# Patient Record
Sex: Female | Born: 1985
Health system: Southern US, Community
[De-identification: ages and names within clinical notes are randomized; demographics above are authoritative.]

## PROBLEM LIST (undated history)

## (undated) ENCOUNTER — Inpatient Hospital Stay (HOSPITAL_COMMUNITY): Payer: Self-pay

## (undated) DIAGNOSIS — D649 Anemia, unspecified: Secondary | ICD-10-CM

## (undated) DIAGNOSIS — G43909 Migraine, unspecified, not intractable, without status migrainosus: Secondary | ICD-10-CM

## (undated) HISTORY — PX: WISDOM TOOTH EXTRACTION: SHX21

## (undated) HISTORY — DX: Migraine, unspecified, not intractable, without status migrainosus: G43.909

---
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
## 2013-11-20 ENCOUNTER — Encounter: Payer: Self-pay | Admitting: Neurology

## 2013-11-23 ENCOUNTER — Encounter: Payer: Self-pay | Admitting: Neurology

## 2013-11-23 ENCOUNTER — Ambulatory Visit (INDEPENDENT_AMBULATORY_CARE_PROVIDER_SITE_OTHER): Payer: BC Managed Care – PPO | Admitting: Neurology

## 2013-11-23 ENCOUNTER — Encounter (INDEPENDENT_AMBULATORY_CARE_PROVIDER_SITE_OTHER): Payer: Self-pay

## 2013-11-23 VITALS — BP 100/70 | HR 93 | Ht 64.0 in | Wt 155.0 lb

## 2013-11-23 DIAGNOSIS — G43009 Migraine without aura, not intractable, without status migrainosus: Secondary | ICD-10-CM | POA: Insufficient documentation

## 2013-11-23 NOTE — Progress Notes (Signed)
[facility]    Provider:  Dr Hosie PoissonSumner Referring Provider: Farris HasMorrow, Aaron, MD Primary Care Physician:  Farris HasMORROW, AARON, MD  CC:  Headache   HPI:  Autumn Evans is a 28 y.o. female here as a referral from Dr. Kateri PlummerMorrow for headache evaluation  6 year history of migraine headaches, has been seen by neurologist in the past and tried on Topamax and Maxalt, no benefit or had concern with side effects. Typically occuring 2x a week, can last a few days. Comes on slowly, progresses to a severe headache. No aura. Typically right temporal, pounding sensation. Notes some nausea and emesis. + Phono and photophobia. Feels better in a dark quiet room. Most recently developed some blurry vision and dizzy sensation. No focal weakness or sensory changes. Headaches can get up to 10/10 but typically 8/10 at its worst. Notes they can be triggered by stress or skipping a caffeine drink (drinks 2 cans a day).  Has tried Excedrin migraine in the past. Recently started Imitrex and Elavil 10mg , has not noted much change but just started. In the past tried Topamax but did not like side effects and did not notice any benefit. Currently taking Ortho-cyclen, has been on this for a few months.   No family history of migraines. Reports having had a CT of her head around 6-7 years ago and she report it was normal.   Review of Systems: Out of a complete 14 system review, the patient complains of only the following symptoms, and all other reviewed systems are negative. + headache, dizziness  History   Social History  . Marital Status: Unknown    Spouse Name: N/A    Number of Children: 1  . Years of Education: masters   Occupational History  . [employer]    Social History Main Topics  . Smoking status: Never Smoker   . Smokeless tobacco: Never Used  . Alcohol Use: Yes     Comment: OCC.  . Drug Use: No  . Sexual Activity: Yes   Other Topics Concern  . Not on file   Social  History Narrative   Patient lives at with her son and boyfriends, she drinks 4 soda's  A day.    Family History  Problem Relation Age of Onset  . Cancer Maternal Grandmother     Past Medical History  Diagnosis Date  . Migraines     No past surgical history on file.  Current Outpatient Prescriptions  Medication Sig Dispense Refill  . amitriptyline (ELAVIL) 10 MG tablet Take 10 mg by mouth at bedtime as needed for sleep.      . norgestimate-ethinyl estradiol (ORTHO-CYCLEN, 28,) 0.25-35 MG-MCG tablet Take 1 tablet by mouth daily.      . SUMAtriptan (IMITREX) 50 MG tablet Take 50 mg by mouth every 2 (two) hours as needed for migraine or headache. May repeat in 2 hours if headache persists or recurs.      . SUMAtriptan Succinate (SUMAVEL DOSEPRO) 6 MG/0.5ML SOTJ Inject 1 Device into the skin as needed.       No current facility-administered medications for this visit.    Allergies as of 11/23/2013  . (No Known Allergies)    Vitals: BP 100/70  Pulse 93  Ht 5\' 4"  (1.626 m)  Wt 155 lb (70.308 kg)  BMI 26.59 kg/m2 Last Weight:  Wt Readings from Last 1 Encounters:  11/23/13 155 lb (70.308 kg)   Last Height:   Ht Readings from Last 1 Encounters:  11/23/13 5\' 4"  (1.626 m)     Physical exam: Exam: Gen: NAD, conversant Eyes: anicteric sclerae, moist conjunctivae HENT: Atraumatic, oropharynx clear Neck: Trachea midline; supple,  Lungs: CTA, no wheezing, rales, rhonic                          CV: RRR, no MRG Abdomen: Soft, non-tender;  Extremities: No peripheral edema  Skin: Normal temperature, no rash,  Psych: Appropriate affect, pleasant  Neuro: MS: AA&Ox3, appropriately interactive, normal affect   Speech: fluent w/o paraphasic error  Memory: good recent and remote recall  CN: PERRL, EOMI no nystagmus, no ptosis, sensation intact to LT V1-V3 bilat, face symmetric, no weakness, hearing grossly intact, palate elevates symmetrically, shoulder shrug 5/5 bilat,    tongue protrudes midline, no fasiculations noted.  Motor: normal bulk and tone Strength: 5/5  In all extremities  Coord: rapid alternating and point-to-point (FNF, HTS) movements intact.  Reflexes: symmetrical, bilat downgoing toes  Sens: LT intact in all extremities  Gait: posture, stance, stride and arm-swing normal. Tandem gait intact. Able to walk on heels and toes. Romberg absent.   Assessment:  After physical and neurologic examination, review of laboratory studies, imaging, neurophysiology testing and pre-existing records, assessment will be reviewed on the problem list.  Plan:  Treatment plan and additional workup will be reviewed under Problem List.  1)Migraine without aura  27y/o woman presenting for initial evaluation of chronic headaches which are consistent with a diagnosis of migraine without aura. Physical exam is overall unremarkable. Patient was recently started on Elavil and Imitrex by her PCP but it appears that she was not taking the Elavil on a nightly basis as instructed. Counseled patient to take the Elavil 10mg  nightly, even if she is not having headache symptoms. Re-instructed her on proper use of the Imitrex. She expressed understanding. Counseled her on potential side effects. Patient will call office in 4 weeks to update me on her headaches. If no improvement would consider MRI/V of the brain, increasing the Elavil or switching prophylactic agents. Follow up in 3 months.   Elspeth Cho, DO  [facility] [address] [place], [place] [address]  Phone [phone] Fax [phone]

## 2013-11-23 NOTE — Patient Instructions (Addendum)
Overall you are doing fairly well but I do want to suggest a few things today:   Remember to drink plenty of fluid, eat healthy meals and do not skip any meals. Try to eat protein with a every meal and eat a healthy snack such as fruit or nuts in between meals. Try to keep a regular sleep-wake schedule and try to exercise daily, particularly in the form of walking, 20-30 minutes a day, if you can.   As far as your medications are concerned, I would like to suggest the following: 1)Please take amitriptyline 10mg  nightly. Please taken this on a nightly basis. 2)Please take the sumitriptan as needed. Please take one tablet immediately at onset of severe headache, if no improvement in 2 hours then take the 2nd tablet. No more than 2 tablets in 24hours.   Please call our office in 4 weeks and let me know how you are doing.   I would like to see you back in 3 months.  Please call us with any interim questions, concerns, problems, updates or refill requests.   My clinical assistant and will answer any of your questions and relay your messages to me and also relay most of my messages to you.   Our phone number is [phone]. We also have an after hours call service for urgent matters and there is a physician on-call for urgent questions. For any emergencies you know to call 911 or go to the nearest emergency room

## 2014-01-29 ENCOUNTER — Encounter (HOSPITAL_COMMUNITY): Payer: Self-pay | Admitting: Emergency Medicine

## 2014-01-29 ENCOUNTER — Emergency Department (HOSPITAL_COMMUNITY)
Admission: EM | Admit: 2014-01-29 | Discharge: 2014-01-29 | Disposition: A | Discharge status: Home / Self Care | Payer: No Typology Code available for payment source | Attending: Emergency Medicine | Admitting: Emergency Medicine

## 2014-01-29 DIAGNOSIS — Y92410 Unspecified street and highway as the place of occurrence of the external cause: Secondary | ICD-10-CM | POA: Insufficient documentation

## 2014-01-29 DIAGNOSIS — Z79899 Other long term (current) drug therapy: Secondary | ICD-10-CM | POA: Insufficient documentation

## 2014-01-29 DIAGNOSIS — S46909A Unspecified injury of unspecified muscle, fascia and tendon at shoulder and upper arm level, unspecified arm, initial encounter: Secondary | ICD-10-CM | POA: Insufficient documentation

## 2014-01-29 DIAGNOSIS — S4980XA Other specified injuries of shoulder and upper arm, unspecified arm, initial encounter: Secondary | ICD-10-CM | POA: Insufficient documentation

## 2014-01-29 DIAGNOSIS — IMO0002 Reserved for concepts with insufficient information to code with codable children: Secondary | ICD-10-CM | POA: Insufficient documentation

## 2014-01-29 DIAGNOSIS — Y9389 Activity, other specified: Secondary | ICD-10-CM | POA: Insufficient documentation

## 2014-01-29 DIAGNOSIS — G43909 Migraine, unspecified, not intractable, without status migrainosus: Secondary | ICD-10-CM | POA: Insufficient documentation

## 2014-01-29 DIAGNOSIS — M549 Dorsalgia, unspecified: Secondary | ICD-10-CM

## 2014-01-29 MED ORDER — METHOCARBAMOL 500 MG PO TABS
500.0000 mg | ORAL_TABLET | Freq: Once | ORAL | Status: DC
  Filled 2014-01-29: qty 1

## 2014-01-29 MED ORDER — METHOCARBAMOL 500 MG PO TABS: 500.0000 mg | ORAL_TABLET | Freq: Two times a day (BID) | ORAL | Status: DC

## 2014-01-29 MED ORDER — IBUPROFEN 600 MG PO TABS: 600.0000 mg | ORAL_TABLET | Freq: Four times a day (QID) | ORAL | Status: DC | PRN

## 2014-01-29 MED ORDER — HYDROCODONE-ACETAMINOPHEN 5-325 MG PO TABS: 1.0000 | ORAL_TABLET | Freq: Four times a day (QID) | ORAL | Status: DC | PRN

## 2014-01-29 MED ORDER — HYDROCODONE-ACETAMINOPHEN 5-325 MG PO TABS
2.0000 | ORAL_TABLET | Freq: Once | ORAL | Status: DC
  Filled 2014-01-29: qty 2

## 2014-01-29 MED ORDER — IBUPROFEN 400 MG PO TABS
800.0000 mg | ORAL_TABLET | Freq: Once | ORAL | Status: DC
  Filled 2014-01-29: qty 2
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
## 2014-02-01 NOTE — ED Provider Notes (Signed)
History/physical exam/procedure(s) were performed by non-physician practitioner and as supervising physician I was immediately available for consultation/collaboration. I have reviewed all notes and am in agreement with care and plan.  Hilario Quarry, MD 02/01/14 [phone]

## 2014-02-02 ENCOUNTER — Emergency Department (HOSPITAL_COMMUNITY): Payer: No Typology Code available for payment source

## 2014-02-02 ENCOUNTER — Emergency Department (HOSPITAL_COMMUNITY)
Admission: EM | Admit: 2014-02-02 | Discharge: 2014-02-02 | Disposition: A | Discharge status: Home / Self Care | Payer: No Typology Code available for payment source | Attending: Emergency Medicine | Admitting: Emergency Medicine

## 2014-02-02 ENCOUNTER — Encounter (HOSPITAL_COMMUNITY): Payer: Self-pay | Admitting: Emergency Medicine

## 2014-02-02 DIAGNOSIS — S93409A Sprain of unspecified ligament of unspecified ankle, initial encounter: Secondary | ICD-10-CM | POA: Insufficient documentation

## 2014-02-02 DIAGNOSIS — Z79899 Other long term (current) drug therapy: Secondary | ICD-10-CM | POA: Insufficient documentation

## 2014-02-02 DIAGNOSIS — Y9389 Activity, other specified: Secondary | ICD-10-CM | POA: Insufficient documentation

## 2014-02-02 DIAGNOSIS — S93401A Sprain of unspecified ligament of right ankle, initial encounter: Secondary | ICD-10-CM

## 2014-02-02 DIAGNOSIS — Z3202 Encounter for pregnancy test, result negative: Secondary | ICD-10-CM | POA: Insufficient documentation

## 2014-02-02 DIAGNOSIS — Y9289 Other specified places as the place of occurrence of the external cause: Secondary | ICD-10-CM | POA: Insufficient documentation

## 2014-02-02 DIAGNOSIS — G43909 Migraine, unspecified, not intractable, without status migrainosus: Secondary | ICD-10-CM | POA: Insufficient documentation

## 2014-02-02 DIAGNOSIS — X500XXA Overexertion from strenuous movement or load, initial encounter: Secondary | ICD-10-CM | POA: Insufficient documentation

## 2014-02-02 LAB — POC URINE PREG, ED: Preg Test, Ur: NEGATIVE

## 2014-02-02 MED ORDER — HYDROCODONE-ACETAMINOPHEN 5-325 MG PO TABS
1.0000 | ORAL_TABLET | Freq: Once | ORAL | Status: AC
  Administered 2014-02-02: 1 via ORAL
  Filled 2014-02-02: qty 1

## 2014-02-02 MED ORDER — IBUPROFEN 600 MG PO TABS: 600.0000 mg | ORAL_TABLET | Freq: Four times a day (QID) | ORAL | Status: DC | PRN

## 2014-02-02 MED ORDER — HYDROCODONE-ACETAMINOPHEN 5-325 MG PO TABS: 1.0000 | ORAL_TABLET | Freq: Four times a day (QID) | ORAL | Status: DC | PRN

## 2014-02-02 NOTE — ED Provider Notes (Addendum)
Medical screening examination/treatment/procedure(s) were performed by non-physician practitioner and as supervising physician I was immediately available for consultation/collaboration.   EKG Interpretation None        Olivia Mackie, MD 02/02/14 2751  Olivia Mackie, MD 02/16/14 [phone]

## 2014-02-02 NOTE — Discharge Instructions (Signed)
Please call your doctor for a followup appointment within 24-48 hours. When you talk to your doctor please let them know that you were seen in the emergency department and have them acquire all of your records so that they can discuss the findings with you and formulate a treatment plan to fully care for your new and ongoing problems. Please call and set up an appointment with your primary care provider to be seen and reassessed this week Please call and set up an appointment with orthopedics to be reassessed Please rest, ice, elevate-toes above his nose Please apply ice at least 5-6 times per day with 20 minute intervals Please take medications as prescribed-while on pain medications there is to be no drinking alcohol, driving, operating any heavy machinery if there is extra please dispose in a proper manner. Please do not take any extra Tylenol with this medication for this can be to Tylenol overdose and liver failure. Please avoid any physical or shortness activity Please use crutches to prevent any weightbearing exercises Please continue to monitor symptoms closely and if symptoms are to worsen or change (fever greater than 101, chills, chest pain, shortness of breath, difficulty breathing, numbness, tingling, fall, injury, loss of sensation, swelling, inability to wiggle toes, redness, warmth to touch, red streaks running up the leg) please report back to the ED immediately    Ankle Sprain An ankle sprain is an injury to the strong, fibrous tissues (ligaments) that hold your ankle bones together.  HOME CARE   Put ice on your ankle for 1 2 days or as told by your doctor.  Put ice in a plastic bag.  Place a towel between your skin and the bag.  Leave the ice on for 15-20 minutes at a time, every 2 hours while you are awake.  Only take medicine as told by your doctor.  Raise (elevate) your injured ankle above the level of your heart as much as possible for 2 3 days.  Use crutches if your  doctor tells you to. Slowly put your own weight on the affected ankle. Use the crutches until you can walk without pain.  If you have a plaster splint:  Do not rest it on anything harder than a pillow for 24 hours.  Do not put weight on it.  Do not get it wet.  Take it off to shower or bathe.  If given, use an elastic wrap or support stocking for support. Take the wrap off if your toes lose feeling (numb), tingle, or turn cold or blue.  If you have an air splint:  Add or let out air to make it comfortable.  Take it off at night and to shower and bathe.  Wiggle your toes and move your ankle up and down often while you are wearing it. GET HELP RIGHT AWAY IF:   Your toes lose feeling (numb) or turn blue.  You have severe pain that is increasing.  You have rapidly increasing bruising or puffiness (swelling).  Your toes feel very cold.  You lose feeling in your foot.  Your medicine does not help your pain. MAKE SURE YOU:   Understand these instructions.  Will watch your condition.  Will get help right away if you are not doing well or get worse. Document Released: 02/06/2008 Document Revised: 05/14/2012 Document Reviewed: 03/03/2012 [facility] Patient Information 2014 [place], [place].

## 2014-02-02 NOTE — ED Provider Notes (Signed)
CSN: [id number]     Arrival date & time 02/02/14  0155 History   First MD Initiated Contact with Patient 02/02/14 0228     Chief Complaint  Patient presents with  . Ankle Pain     (Consider location/radiation/quality/duration/timing/severity/associated sxs/prior Treatment) The history is provided by the patient. No language interpreter was used.  Kharizma Lesnick is a 28 year old female with past medical history migraines presenting to the ED with right ankle pain that started prior to arrival to the ED. As per patient, reported that when she got out of bed to head towards the kitchen she missed the first step and landed on her right ankle in an awkward manner. Reported that at this incident should instantaneous pain described as a constant throbbing sensation localized to lateral aspect of right ankle without radiation. Stated that she did not elevate, ice, take any medications-reported that she came straight to the emergency department. Denied numbness, tingling, loss of sensation, previous injury. PCP Dr. Kateri Plummer  Past Medical History  Diagnosis Date  . Migraines    History reviewed. No pertinent past surgical history. Family History  Problem Relation Age of Onset  . Cancer Maternal Grandmother    History  Substance Use Topics  . Smoking status: Never Smoker   . Smokeless tobacco: Never Used  . Alcohol Use: Yes     Comment: OCC.   OB History   Grav Para Term Preterm Abortions TAB SAB Ect Mult Living                 Review of Systems  Musculoskeletal: Positive for arthralgias (right ankle pain ).  Neurological: Negative for weakness and numbness.      Allergies  Review of patient's allergies indicates no known allergies.  Home Medications   Prior to Admission medications   Medication Sig Start Date End Date Taking? Authorizing Provider  amitriptyline (ELAVIL) 10 MG tablet Take 10 mg by mouth at bedtime as needed for sleep.   Yes Historical Provider, MD  methocarbamol  (ROBAXIN) 500 MG tablet Take 1 tablet (500 mg total) by mouth 2 (two) times daily. 01/29/14  Yes Junius Finner, PA-C  norgestimate-ethinyl estradiol (ORTHO-CYCLEN, 28,) 0.25-35 MG-MCG tablet Take 1 tablet by mouth daily.   Yes Historical Provider, MD  SUMAtriptan (IMITREX) 50 MG tablet Take 50 mg by mouth every 2 (two) hours as needed for migraine or headache. May repeat in 2 hours if headache persists or recurs.   Yes Historical Provider, MD  HYDROcodone-acetaminophen (NORCO/VICODIN) 5-325 MG per tablet Take 1-2 tablets by mouth every 6 (six) hours as needed for moderate pain or severe pain. 01/29/14   Junius Finner, PA-C  ibuprofen (ADVIL,MOTRIN) 600 MG tablet Take 1 tablet (600 mg total) by mouth every 6 (six) hours as needed. 01/29/14   Junius Finner, PA-C   BP 118/74  Pulse 120  Temp(Src) 98.6 F (37 C) (Oral)  Resp 18  SpO2 97%  LMP 01/16/2014 Physical Exam  Nursing note and vitals reviewed. Constitutional: She is oriented to person, place, and time. She appears well-developed and well-nourished. No distress.  HENT:  Head: Normocephalic and atraumatic.  Mouth/Throat: Oropharynx is clear and moist. No oropharyngeal exudate.  Eyes: Conjunctivae and EOM are normal. Right eye exhibits no discharge. Left eye exhibits no discharge.  Neck: Normal range of motion. Neck supple. No tracheal deviation present.  Cardiovascular: Normal rate, regular rhythm and normal heart sounds.  Exam reveals no friction rub.   No murmur heard. Pulses:  Radial pulses are 2+ on the right side, and 2+ on the left side.       Dorsalis pedis pulses are 2+ on the right side, and 2+ on the left side.       Posterior tibial pulses are 2+ on the right side, and 2+ on the left side.  Pulmonary/Chest: Effort normal and breath sounds normal. No respiratory distress. She has no wheezes. She has no rales.  Musculoskeletal: She exhibits edema and tenderness.       Right ankle: She exhibits decreased range of motion,  swelling and ecchymosis. She exhibits no deformity, no laceration and normal pulse. Tenderness. Lateral malleolus and AITFL tenderness found. No medial malleolus, no CF ligament, no posterior TFL, no head of 5th metatarsal and no proximal fibula tenderness found.       Feet:  Mild swelling identified to the lateral aspect of the right ankle - mild beginnings of ecchymosis noted. Negative erythema, inflammation, lesions, sores noted. Discomfort upon palpation to the region. Negative crepitus. Pain with inversion, eversion noted. Patient able to dorsiflex and plantar flex. Patient able to wiggle toes without difficulty.   Lymphadenopathy:    She has no cervical adenopathy.  Neurological: She is alert and oriented to person, place, and time. No cranial nerve deficit. She exhibits normal muscle tone. Coordination normal.  Cranial nerves III-XII grossly intact Strength 5+/5+ to lower extremities bilaterally with resistance applied, equal distribution noted Strength intact to digits of the feet bilaterally  Sensation intact with differentiation to sharp and dull touch   Skin: Skin is warm and dry. No rash noted. She is not diaphoretic. No erythema.  Psychiatric: She has a normal mood and affect. Her behavior is normal. Thought content normal.    ED Course  Procedures (including critical care time)  Results for orders placed during the hospital encounter of 02/02/14  POC URINE PREG, ED      Result Value Ref Range   Preg Test, Ur NEGATIVE  NEGATIVE    Labs Review Labs Reviewed  POC URINE PREG, ED    Imaging Review Dg Ankle Complete Right  02/02/2014   CLINICAL DATA:  Ankle twisting injury.  Pain.  EXAM: RIGHT ANKLE - COMPLETE 3+ VIEW  COMPARISON:  None.  FINDINGS: No fracture. Ankle mortise is normally spaced and aligned. Soft tissues are unremarkable.  IMPRESSION: No fracture or joint abnormality.   Electronically Signed   By: Amie Portland M.D.   On: 02/02/2014 02:40     EKG  Interpretation None      MDM   Final diagnoses:  Right ankle sprain    Medications  HYDROcodone-acetaminophen (NORCO/VICODIN) 5-325 MG per tablet 1 tablet (1 tablet Oral Given 02/02/14 0312)   Filed Vitals:   02/02/14 0200  BP: 118/74  Pulse: 120  Temp: 98.6 F (37 C)  TempSrc: Oral  Resp: 18  SpO2: 97%   Urine pregnancy negative. Plain film of right ankle negative for acute osseous injury, dislocation, or soft tissue abnormality.  Patient neurovascularly intact. Negative focal neurological deficits noted. DP and PT pulses 2+ bilaterally. Patient placed in ASO brace and crutches administered for comfort.  Doubt compartment syndrome. Doubt ischemia. Negative dislocation or fracture noted. Suspicion to be ankle sprain-cannot rule out possible talofibular ligament injury. Patient stable, afebrile. Patient not septic appearing. Discharged patient. Discharged patient with small dose of pain medications - discussed course, precautions, disposal technique - and ibuprofen. Discussed with patient to rest, ice, elevate. Referred to PCP and orthopedics. Discussed  with patient to avoid any physical or strenuous activity. Discussed with patient to closely monitor symptoms and if symptoms are to worsen or change to report back to the ED - strict return instructions given.  Patient agreed to plan of care, understood, all questions answered.     Akeiba Axelson, PA-C 02/02/14 0400

## 2014-02-02 NOTE — ED Notes (Signed)
Pt missed a step and now has pain to right ankle.Autumn Evans

## 2014-02-02 NOTE — ED Notes (Signed)
Pt. In xray 

## 2014-02-18 ENCOUNTER — Ambulatory Visit: Payer: No Typology Code available for payment source | Attending: Family Medicine

## 2014-02-18 DIAGNOSIS — IMO0001 Reserved for inherently not codable concepts without codable children: Secondary | ICD-10-CM | POA: Insufficient documentation

## 2014-02-18 DIAGNOSIS — M545 Low back pain, unspecified: Secondary | ICD-10-CM | POA: Insufficient documentation

## 2014-02-18 DIAGNOSIS — R5381 Other malaise: Secondary | ICD-10-CM | POA: Insufficient documentation

## 2014-02-23 ENCOUNTER — Ambulatory Visit: Payer: BC Managed Care – PPO | Admitting: Neurology

## 2014-10-05 ENCOUNTER — Inpatient Hospital Stay (HOSPITAL_COMMUNITY): Payer: No Typology Code available for payment source

## 2014-10-05 ENCOUNTER — Inpatient Hospital Stay (HOSPITAL_COMMUNITY)
Admission: AD | Admit: 2014-10-05 | Discharge: 2014-10-05 | Disposition: A | Discharge status: Home / Self Care | Payer: No Typology Code available for payment source | Source: Ambulatory Visit | Attending: Family Medicine | Admitting: Family Medicine

## 2014-10-05 ENCOUNTER — Encounter (HOSPITAL_COMMUNITY): Payer: Self-pay | Admitting: *Deleted

## 2014-10-05 DIAGNOSIS — O209 Hemorrhage in early pregnancy, unspecified: Secondary | ICD-10-CM | POA: Insufficient documentation

## 2014-10-05 DIAGNOSIS — O4691 Antepartum hemorrhage, unspecified, first trimester: Secondary | ICD-10-CM

## 2014-10-05 DIAGNOSIS — Z3A01 Less than 8 weeks gestation of pregnancy: Secondary | ICD-10-CM | POA: Diagnosis not present

## 2014-10-05 DIAGNOSIS — R109 Unspecified abdominal pain: Secondary | ICD-10-CM | POA: Diagnosis present

## 2014-10-05 DIAGNOSIS — O4690 Antepartum hemorrhage, unspecified, unspecified trimester: Secondary | ICD-10-CM

## 2014-10-05 LAB — URINALYSIS, ROUTINE W REFLEX MICROSCOPIC
Bilirubin Urine: NEGATIVE
Glucose, UA: NEGATIVE mg/dL
KETONES UR: NEGATIVE mg/dL
NITRITE: NEGATIVE
PH: 6 (ref 5.0–8.0)
Protein, ur: NEGATIVE mg/dL
Specific Gravity, Urine: >1.03 — ABNORMAL HIGH (ref 1.005–1.030)
UROBILINOGEN UA: 0.2 mg/dL (ref 0.0–1.0)

## 2014-10-05 LAB — CBC
HEMATOCRIT: 38.9 % (ref 36.0–46.0)
Hemoglobin: 12.8 g/dL (ref 12.0–15.0)
MCH: 29.6 pg (ref 26.0–34.0)
MCHC: 32.9 g/dL (ref 30.0–36.0)
MCV: 89.8 fL (ref 78.0–100.0)
Platelets: 228 10*3/uL (ref 150–400)
RBC: 4.33 MIL/uL (ref 3.87–5.11)
RDW: 13.5 % (ref 11.5–15.5)
WBC: 6.9 10*3/uL (ref 4.0–10.5)

## 2014-10-05 LAB — URINE MICROSCOPIC-ADD ON

## 2014-10-05 LAB — HCG, QUANTITATIVE, PREGNANCY: HCG, BETA CHAIN, QUANT, S: 17956 m[IU]/mL — AB (ref <5)

## 2014-10-05 LAB — WET PREP, GENITAL
Trich, Wet Prep: NONE SEEN
Yeast Wet Prep HPF POC: NONE SEEN

## 2014-10-05 LAB — POCT PREGNANCY, URINE: Preg Test, Ur: POSITIVE — AB

## 2014-10-05 NOTE — Discharge Instructions (Signed)
Pelvic Rest °Pelvic rest is sometimes recommended for women when:  °· The placenta is partially or completely covering the opening of the cervix (placenta previa). °· There is bleeding between the uterine wall and the amniotic sac in the first trimester (subchorionic hemorrhage). °· The cervix begins to open without labor starting (incompetent cervix, cervical insufficiency). °· The labor is too early (preterm labor). °HOME CARE INSTRUCTIONS °· Do not have sexual intercourse, stimulation, or an orgasm. °· Do not use tampons, douche, or put anything in the vagina. °· Do not lift anything over 10 pounds (4.5 kg). °· Avoid strenuous activity or straining your pelvic muscles. °SEEK MEDICAL CARE IF:  °· You have any vaginal bleeding during pregnancy. Treat this as a potential emergency. °· You have cramping pain felt low in the stomach (stronger than menstrual cramps). °· You notice vaginal discharge (watery, mucus, or bloody). °· You have a low, dull backache. °· There are regular contractions or uterine tightening. °SEEK IMMEDIATE MEDICAL CARE IF: °You have vaginal bleeding and have placenta previa.  °Document Released: 12/15/2010 Document Revised: 11/12/2011 Document Reviewed: 12/15/2010 °ExitCare® Patient Information ©2015 [employer]. This information is not intended to replace advice given to you by your health care provider. Make sure you discuss any questions you have with your health care provider. °Vaginal Bleeding During Pregnancy, First Trimester °A small amount of bleeding (spotting) from the vagina is relatively common in early pregnancy. It usually stops on its own. Various things may cause bleeding or spotting in early pregnancy. Some bleeding may be related to the pregnancy, and some may not. In most cases, the bleeding is normal and is not a problem. However, bleeding can also be a sign of something serious. Be sure to tell your health care provider about any vaginal bleeding right away. °Some  possible causes of vaginal bleeding during the first trimester include: °· Infection or inflammation of the cervix. °· Growths (polyps) on the cervix. °· Miscarriage or threatened miscarriage. °· Pregnancy tissue has developed outside of the uterus and in a fallopian tube (tubal pregnancy). °· Tiny cysts have developed in the uterus instead of pregnancy tissue (molar pregnancy). °HOME CARE INSTRUCTIONS  °Watch your condition for any changes. The following actions may help to lessen any discomfort you are feeling: °· Follow your health care provider's instructions for limiting your activity. If your health care provider orders bed rest, you may need to stay in bed and only get up to use the bathroom. However, your health care provider may allow you to continue light activity. °· If needed, make plans for someone to help with your regular activities and responsibilities while you are on bed rest. °· Keep track of the number of pads you use each day, how often you change pads, and how soaked (saturated) they are. Write this down. °· Do not use tampons. Do not douche. °· Do not have sexual intercourse or orgasms until approved by your health care provider. °· If you pass any tissue from your vagina, save the tissue so you can show it to your health care provider. °· Only take over-the-counter or prescription medicines as directed by your health care provider. °· Do not take aspirin because it can make you bleed. °· Keep all follow-up appointments as directed by your health care provider. °SEEK MEDICAL CARE IF: °· You have any vaginal bleeding during any part of your pregnancy. °· You have cramps or labor pains. °· You have a fever, not controlled by medicine. °SEEK IMMEDIATE MEDICAL   CARE IF:  °· You have severe cramps in your back or belly (abdomen). °· You pass large clots or tissue from your vagina. °· Your bleeding increases. °· You feel light-headed or weak, or you have fainting episodes. °· You have chills. °· You  are leaking fluid or have a gush of fluid from your vagina. °· You pass out while having a bowel movement. °MAKE SURE YOU: °· Understand these instructions. °· Will watch your condition. °· Will get help right away if you are not doing well or get worse. °Document Released: 05/30/2005 Document Revised: 08/25/2013 Document Reviewed: 04/27/2013 °ExitCare® Patient Information ©2015 [employer]. This information is not intended to replace advice given to you by your health care provider. Make sure you discuss any questions you have with your health care provider. ° °

## 2014-10-05 NOTE — MAU Note (Signed)
+  HPT 1-2wks ago.  Saw some blood when wiped this morning.  None since.  Has been cramping today.

## 2014-10-05 NOTE — MAU Provider Note (Signed)
CC: Possible Pregnancy; Abdominal Cramping; and Vaginal Bleeding    First Provider Initiated Contact with Patient 10/05/14 2001      HPI Autumn Evans is a 29 y.o. G3P1011 2355w6d who presents with onset 2 days ago of intermittent mild menstrual-like cramps. This morning had streaking of pink blood on toilet tissue. Denies dysuria, hematuria, urgency. Denies abnormal vaginal discharge.  LMP 08/25/14.   Past Medical History  Diagnosis Date  . Migraines     OB History  Gravida Para Term Preterm AB SAB TAB Ectopic Multiple Living  3    1  1   1     # Outcome Date GA Lbr Len/2nd Weight Sex Delivery Anes PTL Lv  3 Current           2 Gravida      Vag-Spont     1 TAB               History reviewed. No pertinent past surgical history.  History   Social History  . Marital Status: Single    Spouse Name: N/A    Number of Children: 1  . Years of Education: masters   Occupational History  . [employer]    Social History Main Topics  . Smoking status: Never Smoker   . Smokeless tobacco: Never Used  . Alcohol Use: Yes     Comment: OCC.LAST TIME-   09-18-2013  . Drug Use: No  . Sexual Activity: Yes   Other Topics Concern  . Not on file   Social History Narrative   Patient lives at with her son and boyfriends, she drinks 4 soda's  A day.    No current facility-administered medications on file prior to encounter.   Current Outpatient Prescriptions on File Prior to Encounter  Medication Sig Dispense Refill  . HYDROcodone-acetaminophen (NORCO/VICODIN) 5-325 MG per tablet Take 1-2 tablets by mouth every 6 (six) hours as needed for moderate pain or severe pain. (Patient not taking: Reported on 10/05/2014) 6 tablet 0  . HYDROcodone-acetaminophen (NORCO/VICODIN) 5-325 MG per tablet Take 1 tablet by mouth every 6 (six) hours as needed for moderate pain or severe pain. (Patient not taking: Reported on 10/05/2014) 5 tablet 0  . ibuprofen (ADVIL,MOTRIN) 600 MG tablet Take  1 tablet (600 mg total) by mouth every 6 (six) hours as needed. (Patient not taking: Reported on 10/05/2014) 30 tablet 0  . ibuprofen (ADVIL,MOTRIN) 600 MG tablet Take 1 tablet (600 mg total) by mouth every 6 (six) hours as needed for moderate pain. (Patient not taking: Reported on 10/05/2014) 15 tablet 0  . methocarbamol (ROBAXIN) 500 MG tablet Take 1 tablet (500 mg total) by mouth 2 (two) times daily. (Patient not taking: Reported on 10/05/2014) 10 tablet 0    No Known Allergies   ROS Pertinent items in HPI   PHYSICAL EXAM Filed Vitals:   10/05/14 2017  BP: 118/76  Pulse: 84  Temp:   Resp:   Repeat BP:  General: Well nourished, well developed female in no acute distress Cardiovascular: Normal rate Respiratory: Normal effort Abdomen: Soft, nontender Back: No CVAT Extremities: No edema Neurologic:grossly intact Speculum exam: NEFG; vagina with scant brown-tinged discharge/ blood; cervix clean  Bimanual exam: cervix closed, no CMT; uterus slightly enlarged; no adnexal tenderness or masses   LAB RESULTS Results for orders placed or performed during the hospital encounter of 10/05/14 (from the past 24 hour(s))  Urinalysis, Routine w reflex microscopic     Status: Abnormal   Collection  Time: 10/05/14  6:20 PM  Result Value Ref Range   Color, Urine YELLOW YELLOW   APPearance CLEAR CLEAR   Specific Gravity, Urine >1.030 (H) 1.005 - 1.030   pH 6.0 5.0 - 8.0   Glucose, UA NEGATIVE NEGATIVE mg/dL   Hgb urine dipstick TRACE (A) NEGATIVE   Bilirubin Urine NEGATIVE NEGATIVE   Ketones, ur NEGATIVE NEGATIVE mg/dL   Protein, ur NEGATIVE NEGATIVE mg/dL   Urobilinogen, UA 0.2 0.0 - 1.0 mg/dL   Nitrite NEGATIVE NEGATIVE   Leukocytes, UA SMALL (A) NEGATIVE  Urine microscopic-add on     Status: Abnormal   Collection Time: 10/05/14  6:20 PM  Result Value Ref Range   Squamous Epithelial / LPF FEW (A) RARE   WBC, UA 0-2 <3 WBC/hpf   RBC / HPF 0-2 <3 RBC/hpf   Urine-Other MUCOUS PRESENT    Pregnancy, urine POC     Status: Abnormal   Collection Time: 10/05/14  6:28 PM  Result Value Ref Range   Preg Test, Ur POSITIVE (A) NEGATIVE  Wet prep, genital     Status: Abnormal   Collection Time: 10/05/14  8:06 PM  Result Value Ref Range   Yeast Wet Prep HPF POC NONE SEEN NONE SEEN   Trich, Wet Prep NONE SEEN NONE SEEN   Clue Cells Wet Prep HPF POC FEW (A) NONE SEEN   WBC, Wet Prep HPF POC FEW (A) NONE SEEN  hCG, quantitative, pregnancy     Status: Abnormal   Collection Time: 10/05/14  8:11 PM  Result Value Ref Range   hCG, Beta Chain, Quant, S 17956 (H) <5 mIU/mL  ABO/Rh     Status: None (Preliminary result)   Collection Time: 10/05/14  8:11 PM  Result Value Ref Range   ABO/RH(D) O POS   CBC     Status: None   Collection Time: 10/05/14  8:11 PM  Result Value Ref Range   WBC 6.9 4.0 - 10.5 K/uL   RBC 4.33 3.87 - 5.11 MIL/uL   Hemoglobin 12.8 12.0 - 15.0 g/dL   HCT 16.1 09.6 - 04.5 %   MCV 89.8 78.0 - 100.0 fL   MCH 29.6 26.0 - 34.0 pg   MCHC 32.9 30.0 - 36.0 g/dL   RDW 40.9 81.1 - 91.4 %   Platelets 228 150 - 400 K/uL    IMAGING US Ob Comp Less 14 Wks  10/05/2014   CLINICAL DATA:  Pregnant patient with vaginal bleeding and cramping.  EXAM: OBSTETRIC <14 WK [place] AND TRANSVAGINAL OB US  TECHNIQUE: Both transabdominal and transvaginal ultrasound examinations were performed for complete evaluation of the gestation as well as the maternal uterus, adnexal regions, and pelvic cul-de-sac. Transvaginal technique was performed to assess early pregnancy.  COMPARISON:  None.  FINDINGS: Intrauterine gestational sac: Visualized/normal in shape.  Yolk sac:  Not seen.  Embryo:  Not seen.  MSD:  9.9  mm   5 w   5  d                [place] EDC: 06/02/2015  Maternal uterus/adnexae: No subchorionic hemorrhage. Both ovaries are well seen and appear normal. There is no pelvic free fluid.  IMPRESSION: Probable early intrauterine gestational sac, but no yolk sac, fetal pole, or cardiac activity yet  visualized. Recommend follow-up quantitative B-HCG levels and follow-up US in 14 days to confirm and assess viability. This recommendation follows SRU consensus guidelines: Diagnostic Criteria for Nonviable Pregnancy Early in the First Trimester. N Engl J  Med 2013; 161:0960-45.   Electronically Signed   By: Rubye Oaks M.D.   On: 10/05/2014 21:11   US Ob Transvaginal  10/05/2014   CLINICAL DATA:  Pregnant patient with vaginal bleeding and cramping.  EXAM: OBSTETRIC <14 WK [place] AND TRANSVAGINAL OB US  TECHNIQUE: Both transabdominal and transvaginal ultrasound examinations were performed for complete evaluation of the gestation as well as the maternal uterus, adnexal regions, and pelvic cul-de-sac. Transvaginal technique was performed to assess early pregnancy.  COMPARISON:  None.  FINDINGS: Intrauterine gestational sac: Visualized/normal in shape.  Yolk sac:  Not seen.  Embryo:  Not seen.  MSD:  9.9  mm   5 w   5  d                [place] EDC: 06/02/2015  Maternal uterus/adnexae: No subchorionic hemorrhage. Both ovaries are well seen and appear normal. There is no pelvic free fluid.  IMPRESSION: Probable early intrauterine gestational sac, but no yolk sac, fetal pole, or cardiac activity yet visualized. Recommend follow-up quantitative B-HCG levels and follow-up US in 14 days to confirm and assess viability. This recommendation follows SRU consensus guidelines: Diagnostic Criteria for Nonviable Pregnancy Early in the First Trimester. Malva Limes Med 2013; 409:8119-14.   Electronically Signed   By: Rubye Oaks M.D.   On: 10/05/2014 21:11   Prelim [place]: nothing in uterus or adnexae  MAU COURSE Care assumed by Vonzella Nipple at [facility] assumed from Caren Griffins, [place]. Patient waiting for [place], labs pending Discussed concern for elevated hCG and IUGS only noted on [place]. Strict ectopic precautions discussed. Patient appeared comfortable and without pain at time of discharge.   ASSESSMENT G3P1011  1. Vaginal bleeding  in pregnancy     PLAN Discharge home Bleeding/ectopic precautions discussed Patient to return to MAU in 48 hours for follow-up quant hCG or sooner if symptoms change or worsen    Marny Lowenstein, PA-C 10/05/2014 9:18 PM

## 2014-10-05 NOTE — MAU Note (Signed)
PT  SAYS  LAST NIGHT  SHE SAW A STREAK OF PINK ON  TOILET PAPER ,   THEN THIS AM  WHEN SHE WIPED  SAW MORE PINK  .  NO PAD ON NOW.     CRAMPING  STARTED  ON Sunday-  COME/ GO.     TODAY  SAME . DID HPT- ON  1-24- POSITIVE   .    WAS TAKING   ORCELLA-  BCP-  STOPPED    08-13-2014.  LAST SEX -  1-8-.

## 2014-10-06 LAB — GC/CHLAMYDIA PROBE AMP (~~LOC~~) NOT AT ARMC
Chlamydia: NEGATIVE
Neisseria Gonorrhea: NEGATIVE

## 2014-10-06 LAB — ABO/RH: ABO/RH(D): O POS

## 2014-10-07 ENCOUNTER — Inpatient Hospital Stay (HOSPITAL_COMMUNITY)
Admission: AD | Admit: 2014-10-07 | Discharge: 2014-10-07 | Disposition: A | Discharge status: Home / Self Care | Payer: No Typology Code available for payment source | Source: Ambulatory Visit | Attending: Family Medicine | Admitting: Family Medicine

## 2014-10-07 DIAGNOSIS — O200 Threatened abortion: Secondary | ICD-10-CM | POA: Insufficient documentation

## 2014-10-07 DIAGNOSIS — O26851 Spotting complicating pregnancy, first trimester: Secondary | ICD-10-CM | POA: Diagnosis present

## 2014-10-07 DIAGNOSIS — Z3A01 Less than 8 weeks gestation of pregnancy: Secondary | ICD-10-CM | POA: Insufficient documentation

## 2014-10-07 LAB — HCG, QUANTITATIVE, PREGNANCY: HCG, BETA CHAIN, QUANT, S: 26051 m[IU]/mL — AB (ref <5)

## 2014-10-07 NOTE — MAU Provider Note (Signed)
  History     CSN: [id number]  Arrival date and time: 10/07/14 1742   None     Chief Complaint  Patient presents with  . Follow-up   HPI Comments: Autumn Evans 29 y.o. [id number] 2947w1d presents to MAU for follow up quant. She has continued to have some spotting. She is actually a patient of [facility] and would like to be followed in their office.     Past Medical History  Diagnosis Date  . Migraines     No past surgical history on file.  Family History  Problem Relation Age of Onset  . Cancer Maternal Grandmother     History  Substance Use Topics  . Smoking status: Never Smoker   . Smokeless tobacco: Never Used  . Alcohol Use: Yes     Comment: OCC.LAST TIME-   09-18-2013    Allergies: No Known Allergies  No prescriptions prior to admission    Review of Systems  Constitutional: Negative.   Genitourinary:       Spotting  Skin: Negative.   Neurological: Negative.   Psychiatric/Behavioral: Negative.    Physical Exam   Blood pressure 132/76, pulse 93, temperature 98.9 F (37.2 C), temperature source Oral, resp. rate 18, last menstrual period 08/25/2014, SpO2 100 %.  Physical Exam  Constitutional: She is oriented to person, place, and time. She appears well-developed and well-nourished. No distress.  HENT:  Head: Normocephalic and atraumatic.  Eyes: Conjunctivae are normal. Pupils are equal, round, and reactive to light.  Musculoskeletal: Normal range of motion.  Neurological: She is alert and oriented to person, place, and time.  Skin: Skin is warm and dry.  Psychiatric: She has a normal mood and affect. Her behavior is normal. Judgment and thought content normal.   Results for orders placed or performed during the hospital encounter of 10/07/14 (from the past 24 hour(s))  hCG, quantitative, pregnancy     Status: Abnormal   Collection Time: 10/07/14  5:55 PM  Result Value Ref Range   hCG, Beta Nyra JabsChain, Quant, Kathie RhodesS [id number] (H) <5 mIU/mL   Lab Results  Component Value  Date   HCGBETAQNT [id number]* 10/07/2014   HCGBETAQNT 17956* 10/05/2014      MAU Course  Procedures  MDM   Assessment and Plan   A: Threatened Miscarriage  P: Advised to call [facility] asap and follow up with them Advised if bleeding/ pain worsens return to MAU  Carolynn ServeBarefoot, Wilbert Hayashi Miller 10/07/2014, 7:50 PM

## 2014-10-07 NOTE — MAU Note (Signed)
Pt here for f/u BHCG. Had some more spotting last night no pain or cramping.

## 2014-10-07 NOTE — Discharge Instructions (Signed)
Threatened Miscarriage °A threatened miscarriage occurs when you have vaginal bleeding during your first 20 weeks of pregnancy but the pregnancy has not ended. If you have vaginal bleeding during this time, your health care provider will do tests to make sure you are still pregnant. If the tests show you are still pregnant and the developing baby (fetus) inside your womb (uterus) is still growing, your condition is considered a threatened miscarriage. °A threatened miscarriage does not mean your pregnancy will end, but it does increase the risk of losing your pregnancy (complete miscarriage). °CAUSES  °The cause of a threatened miscarriage is usually not known. If you go on to have a complete miscarriage, the most common cause is an abnormal number of chromosomes in the developing baby. Chromosomes are the structures inside cells that hold all your genetic material. °Some causes of vaginal bleeding that do not result in miscarriage include: °· Having sex. °· Having an infection. °· Normal hormone changes of pregnancy. °· Bleeding that occurs when an egg implants in your uterus. °RISK FACTORS °Risk factors for bleeding in early pregnancy include: °· Obesity. °· Smoking. °· Drinking excessive amounts of alcohol or caffeine. °· Recreational drug use. °SIGNS AND SYMPTOMS °· Light vaginal bleeding. °· Mild abdominal pain or cramps. °DIAGNOSIS  °If you have bleeding with or without abdominal pain before 20 weeks of pregnancy, your health care provider will do tests to check whether you are still pregnant. One important test involves using sound waves and a computer (ultrasound) to create images of the inside of your uterus. Other tests include an internal exam of your vagina and uterus (pelvic exam) and measurement of your baby's heart rate.  °You may be diagnosed with a threatened miscarriage if: °· Ultrasound testing shows you are still pregnant. °· Your baby's heart rate is strong. °· A pelvic exam shows that the  opening between your uterus and your vagina (cervix) is closed. °· Your heart rate and blood pressure are stable. °· Blood tests confirm you are still pregnant. °TREATMENT  °No treatments have been shown to prevent a threatened miscarriage from going on to a complete miscarriage. However, the right home care is important.  °HOME CARE INSTRUCTIONS  °· Make sure you keep all your appointments for prenatal care. This is very important. °· Get plenty of rest. °· Do not have sex or use tampons if you have vaginal bleeding. °· Do not douche. °· Do not smoke or use recreational drugs. °· Do not drink alcohol. °· Avoid caffeine. °SEEK MEDICAL CARE IF: °· You have light vaginal bleeding or spotting while pregnant. °· You have abdominal pain or cramping. °· You have a fever. °SEEK IMMEDIATE MEDICAL CARE IF: °· You have heavy vaginal bleeding. °· You have blood clots coming from your vagina. °· You have severe low back pain or abdominal cramps. °· You have fever, chills, and severe abdominal pain. °MAKE SURE YOU: °· Understand these instructions. °· Will watch your condition. °· Will get help right away if you are not doing well or get worse. °Document Released: 08/20/2005 Document Revised: 08/25/2013 Document Reviewed: 06/16/2013 °ExitCare® Patient Information ©2015 [employer]. This information is not intended to replace advice given to you by your health care provider. Make sure you discuss any questions you have with your health care provider. ° °Vaginal Bleeding During Pregnancy, First Trimester °A small amount of bleeding (spotting) from the vagina is relatively common in early pregnancy. It usually stops on its own. Various things may cause bleeding   or spotting in early pregnancy. Some bleeding may be related to the pregnancy, and some may not. In most cases, the bleeding is normal and is not a problem. However, bleeding can also be a sign of something serious. Be sure to tell your health care provider about any  vaginal bleeding right away. °Some possible causes of vaginal bleeding during the first trimester include: °· Infection or inflammation of the cervix. °· Growths (polyps) on the cervix. °· Miscarriage or threatened miscarriage. °· Pregnancy tissue has developed outside of the uterus and in a fallopian tube (tubal pregnancy). °· Tiny cysts have developed in the uterus instead of pregnancy tissue (molar pregnancy). °HOME CARE INSTRUCTIONS  °Watch your condition for any changes. The following actions may help to lessen any discomfort you are feeling: °· Follow your health care provider's instructions for limiting your activity. If your health care provider orders bed rest, you may need to stay in bed and only get up to use the bathroom. However, your health care provider may allow you to continue light activity. °· If needed, make plans for someone to help with your regular activities and responsibilities while you are on bed rest. °· Keep track of the number of pads you use each day, how often you change pads, and how soaked (saturated) they are. Write this down. °· Do not use tampons. Do not douche. °· Do not have sexual intercourse or orgasms until approved by your health care provider. °· If you pass any tissue from your vagina, save the tissue so you can show it to your health care provider. °· Only take over-the-counter or prescription medicines as directed by your health care provider. °· Do not take aspirin because it can make you bleed. °· Keep all follow-up appointments as directed by your health care provider. °SEEK MEDICAL CARE IF: °· You have any vaginal bleeding during any part of your pregnancy. °· You have cramps or labor pains. °· You have a fever, not controlled by medicine. °SEEK IMMEDIATE MEDICAL CARE IF:  °· You have severe cramps in your back or belly (abdomen). °· You pass large clots or tissue from your vagina. °· Your bleeding increases. °· You feel light-headed or weak, or you have fainting  episodes. °· You have chills. °· You are leaking fluid or have a gush of fluid from your vagina. °· You pass out while having a bowel movement. °MAKE SURE YOU: °· Understand these instructions. °· Will watch your condition. °· Will get help right away if you are not doing well or get worse. °Document Released: 05/30/2005 Document Revised: 08/25/2013 Document Reviewed: 04/27/2013 °ExitCare® Patient Information ©2015 [employer]. This information is not intended to replace advice given to you by your health care provider. Make sure you discuss any questions you have with your health care provider. ° °Pelvic Rest °Pelvic rest is sometimes recommended for women when:  °· The placenta is partially or completely covering the opening of the cervix (placenta previa). °· There is bleeding between the uterine wall and the amniotic sac in the first trimester (subchorionic hemorrhage). °· The cervix begins to open without labor starting (incompetent cervix, cervical insufficiency). °· The labor is too early (preterm labor). °HOME CARE INSTRUCTIONS °· Do not have sexual intercourse, stimulation, or an orgasm. °· Do not use tampons, douche, or put anything in the vagina. °· Do not lift anything over 10 pounds (4.5 kg). °· Avoid strenuous activity or straining your pelvic muscles. °SEEK MEDICAL CARE IF:  °· You have   any vaginal bleeding during pregnancy. Treat this as a potential emergency. °· You have cramping pain felt low in the stomach (stronger than menstrual cramps). °· You notice vaginal discharge (watery, mucus, or bloody). °· You have a low, dull backache. °· There are regular contractions or uterine tightening. °SEEK IMMEDIATE MEDICAL CARE IF: °You have vaginal bleeding and have placenta previa.  °Document Released: 12/15/2010 Document Revised: 11/12/2011 Document Reviewed: 12/15/2010 °ExitCare® Patient Information ©2015 [employer]. This information is not intended to replace advice given to you by your health care  provider. Make sure you discuss any questions you have with your health care provider. ° °

## 2014-10-21 ENCOUNTER — Other Ambulatory Visit: Payer: Self-pay | Admitting: Obstetrics & Gynecology

## 2014-10-25 ENCOUNTER — Encounter (HOSPITAL_COMMUNITY): Payer: Self-pay | Admitting: *Deleted

## 2014-10-25 NOTE — Anesthesia Preprocedure Evaluation (Addendum)
Anesthesia Evaluation  Patient identified by MRN, date of birth, ID band Patient awake    Reviewed: Allergy & Precautions, NPO status , Patient's Chart, lab work & pertinent test results  History of Anesthesia Complications Negative for: history of anesthetic complications  Airway Mallampati: II  TM Distance: >3 FB Neck ROM: Full    Dental no notable dental hx. (+) Dental Advisory Given   Pulmonary neg pulmonary ROS,  breath sounds clear to auscultation  Pulmonary exam normal       Cardiovascular negative cardio ROS  Rhythm:Regular Rate:Normal     Neuro/Psych  Headaches, negative psych ROS   GI/Hepatic negative GI ROS, Neg liver ROS,   Endo/Other  negative endocrine ROS  Renal/GU negative Renal ROS  negative genitourinary   Musculoskeletal negative musculoskeletal ROS (+)   Abdominal   Peds negative pediatric ROS (+)  Hematology  (+) anemia ,   Anesthesia Other Findings   Reproductive/Obstetrics (+) Pregnancy 9 weeks                            Anesthesia Physical Anesthesia Plan  ASA: II  Anesthesia Plan: MAC   Post-op Pain Management:    Induction: Intravenous  Airway Management Planned:   Additional Equipment:   Intra-op Plan:   Post-operative Plan:   Informed Consent: I have reviewed the patients History and Physical, chart, labs and discussed the procedure including the risks, benefits and alternatives for the proposed anesthesia with the patient or authorized representative who has indicated his/her understanding and acceptance.   Dental advisory given  Plan Discussed with: CRNA  Anesthesia Plan Comments:         Anesthesia Quick Evaluation

## 2014-10-26 ENCOUNTER — Ambulatory Visit (HOSPITAL_COMMUNITY)
Admission: RE | Admit: 2014-10-26 | Discharge: 2014-10-26 | Disposition: A | Discharge status: Home / Self Care | Payer: No Typology Code available for payment source | Source: Ambulatory Visit | Attending: Obstetrics and Gynecology | Admitting: Obstetrics and Gynecology

## 2014-10-26 ENCOUNTER — Encounter (HOSPITAL_COMMUNITY): Payer: Self-pay

## 2014-10-26 ENCOUNTER — Encounter (HOSPITAL_COMMUNITY)
Admission: RE | Disposition: A | Discharge status: Home / Self Care | Payer: Self-pay | Source: Ambulatory Visit | Attending: Obstetrics and Gynecology

## 2014-10-26 ENCOUNTER — Ambulatory Visit (HOSPITAL_COMMUNITY): Payer: No Typology Code available for payment source | Admitting: Anesthesiology

## 2014-10-26 DIAGNOSIS — Z862 Personal history of diseases of the blood and blood-forming organs and certain disorders involving the immune mechanism: Secondary | ICD-10-CM | POA: Insufficient documentation

## 2014-10-26 DIAGNOSIS — G43909 Migraine, unspecified, not intractable, without status migrainosus: Secondary | ICD-10-CM | POA: Diagnosis not present

## 2014-10-26 DIAGNOSIS — Z79899 Other long term (current) drug therapy: Secondary | ICD-10-CM | POA: Diagnosis not present

## 2014-10-26 DIAGNOSIS — Z3A09 9 weeks gestation of pregnancy: Secondary | ICD-10-CM | POA: Insufficient documentation

## 2014-10-26 DIAGNOSIS — O020 Blighted ovum and nonhydatidiform mole: Secondary | ICD-10-CM | POA: Diagnosis not present

## 2014-10-26 HISTORY — DX: Anemia, unspecified: D64.9

## 2014-10-26 HISTORY — PX: DILATION AND CURETTAGE OF UTERUS: SHX78

## 2014-10-26 LAB — CBC
HCT: 35.8 % — ABNORMAL LOW (ref 36.0–46.0)
Hemoglobin: 11.8 g/dL — ABNORMAL LOW (ref 12.0–15.0)
MCH: 30 pg (ref 26.0–34.0)
MCHC: 33 g/dL (ref 30.0–36.0)
MCV: 91.1 fL (ref 78.0–100.0)
Platelets: 197 10*3/uL (ref 150–400)
RBC: 3.93 MIL/uL (ref 3.87–5.11)
RDW: 13.7 % (ref 11.5–15.5)
WBC: 5 10*3/uL (ref 4.0–10.5)

## 2014-10-26 SURGERY — DILATION AND CURETTAGE
Anesthesia: Monitor Anesthesia Care | Site: Vagina

## 2014-10-26 MED ORDER — MIDAZOLAM HCL 2 MG/2ML IJ SOLN
INTRAMUSCULAR | Status: DC | PRN
  Administered 2014-10-26: 2 mg via INTRAVENOUS

## 2014-10-26 MED ORDER — OXYCODONE-ACETAMINOPHEN 5-325 MG PO TABS: 1.0000 | ORAL_TABLET | Freq: Four times a day (QID) | ORAL | Status: DC | PRN

## 2014-10-26 MED ORDER — SCOPOLAMINE 1 MG/3DAYS TD PT72
1.0000 | MEDICATED_PATCH | Freq: Once | TRANSDERMAL | Status: DC
  Administered 2014-10-26: 1.5 mg via TRANSDERMAL

## 2014-10-26 MED ORDER — MIDAZOLAM HCL 2 MG/2ML IJ SOLN
INTRAMUSCULAR | Status: AC
Start: 2014-10-26 — End: 2014-10-26
  Filled 2014-10-26: qty 2

## 2014-10-26 MED ORDER — ONDANSETRON HCL 4 MG/2ML IJ SOLN
INTRAMUSCULAR | Status: DC | PRN
  Administered 2014-10-26: 4 mg via INTRAVENOUS

## 2014-10-26 MED ORDER — LIDOCAINE HCL 2 % IJ SOLN
INTRAMUSCULAR | Status: AC
  Filled 2014-10-26: qty 20

## 2014-10-26 MED ORDER — LACTATED RINGERS IV SOLN
INTRAVENOUS | Status: DC
Start: 2014-10-26 — End: 2014-10-26
  Administered 2014-10-26: 125 mL/h via INTRAVENOUS

## 2014-10-26 MED ORDER — LIDOCAINE HCL 2 % IJ SOLN
INTRAMUSCULAR | Status: DC | PRN
  Administered 2014-10-26: 10 mL

## 2014-10-26 MED ORDER — FENTANYL CITRATE 0.05 MG/ML IJ SOLN
25.0000 ug | INTRAMUSCULAR | Status: DC | PRN
  Administered 2014-10-26: 50 ug via INTRAVENOUS

## 2014-10-26 MED ORDER — FENTANYL CITRATE 0.05 MG/ML IJ SOLN
INTRAMUSCULAR | Status: DC | PRN
  Administered 2014-10-26: 100 ug via INTRAVENOUS

## 2014-10-26 MED ORDER — SCOPOLAMINE 1 MG/3DAYS TD PT72
MEDICATED_PATCH | TRANSDERMAL | Status: AC
  Administered 2014-10-26: 1.5 mg via TRANSDERMAL
  Filled 2014-10-26: qty 1

## 2014-10-26 MED ORDER — FENTANYL CITRATE 0.05 MG/ML IJ SOLN
INTRAMUSCULAR | Status: AC
  Filled 2014-10-26: qty 2

## 2014-10-26 MED ORDER — KETOROLAC TROMETHAMINE 30 MG/ML IJ SOLN
INTRAMUSCULAR | Status: DC | PRN
  Administered 2014-10-26: 30 mg via INTRAVENOUS

## 2014-10-26 MED ORDER — IBUPROFEN 600 MG PO TABS: 600.0000 mg | ORAL_TABLET | Freq: Four times a day (QID) | ORAL | Status: DC | PRN

## 2014-10-26 MED ORDER — ONDANSETRON HCL 4 MG/2ML IJ SOLN: 4.0000 mg | Freq: Once | INTRAMUSCULAR | Status: DC | PRN

## 2014-10-26 MED ORDER — FENTANYL CITRATE 0.05 MG/ML IJ SOLN
INTRAMUSCULAR | Status: AC
  Administered 2014-10-26: 50 ug via INTRAVENOUS
  Filled 2014-10-26: qty 2

## 2014-10-26 MED ORDER — PROPOFOL 10 MG/ML IV EMUL
INTRAVENOUS | Status: DC | PRN
  Administered 2014-10-26 (×3): 50 mg via INTRAVENOUS

## 2014-10-26 MED ORDER — LIDOCAINE HCL (CARDIAC) 20 MG/ML IV SOLN
INTRAVENOUS | Status: DC | PRN
  Administered 2014-10-26: 50 mg via INTRAVENOUS

## 2014-10-26 SURGICAL SUPPLY — 15 items
CATH ROBINSON RED A/P 16FR (CATHETERS) ×2 IMPLANT
CLOTH BEACON ORANGE TIMEOUT ST (SAFETY) ×2 IMPLANT
CONTAINER PREFILL 10% NBF 60ML (FORM) ×4 IMPLANT
GLOVE BIO SURGEON STRL SZ7.5 (GLOVE) ×2 IMPLANT
GLOVE BIOGEL PI IND STRL 7.5 (GLOVE) ×1 IMPLANT
GLOVE BIOGEL PI INDICATOR 7.5 (GLOVE) ×1
GOWN STRL REUS W/TWL LRG LVL3 (GOWN DISPOSABLE) ×4 IMPLANT
PACK VAGINAL MINOR WOMEN LF (CUSTOM PROCEDURE TRAY) ×2 IMPLANT
PAD OB MATERNITY 4.3X12.25 (PERSONAL CARE ITEMS) ×2 IMPLANT
PAD PREP 24X48 CUFFED NSTRL (MISCELLANEOUS) ×2 IMPLANT
SCRUB FOAM SURGICAL 12 800ML (MISCELLANEOUS) ×2 IMPLANT
SET BERKELEY SUCTION TUBING (SUCTIONS) ×2 IMPLANT
TOWEL OR 17X24 6PK STRL BLUE (TOWEL DISPOSABLE) ×4 IMPLANT
VACURETTE 8 RIGID CVD (CANNULA) ×2 IMPLANT
WATER STERILE IRR 1000ML POUR (IV SOLUTION) ×2 IMPLANT

## 2014-10-26 NOTE — H&P (Signed)
Autumn Evans is an 29 y.o. female. She is scheduled for suction D&C after being diagnosed in office with blighted ovum/missed ab with abnormally rising b-hcgs and u/s with only a gestational sac, no yolk sac or fetal pole.    Pertinent Gynecological History: H/o regular cycles and h/o nl pap  Menstrual History: Patient's last menstrual period was 08/25/2014 (exact date).    Past Medical History  Diagnosis Date  . SVD (spontaneous vaginal delivery)     x 1  . Migraines     otc med prn  . Anemia     Past Surgical History  Procedure Laterality Date  . Wisdom tooth extraction      Family History  Problem Relation Age of Onset  . Cancer Maternal Grandmother     Social History:  reports that she has never smoked. She has never used smokeless tobacco. She reports that she drinks alcohol. She reports that she does not use illicit drugs.  Allergies: No Known Allergies  Prescriptions prior to admission  Medication Sig Dispense Refill Last Dose  . Prenatal Vit-Fe Fumarate-FA (PRENATAL MULTIVITAMIN) TABS tablet Take 1 tablet by mouth daily at 12 noon.   Past Week at Unknown time    ROS Non-contributory  Blood pressure 108/64, pulse 78, temperature 98.2 F (36.8 C), temperature source Oral, resp. rate 18, height 5\' 4"  (1.626 m), weight 70.308 kg (155 lb), last menstrual period 08/25/2014, SpO2 100 %. Physical Exam  Lungs CTA CV RRR Abd soft, NT Ext no calf tenderness  Results for orders placed or performed during the hospital encounter of 10/26/14 (from the past 24 hour(s))  CBC     Status: Abnormal   Collection Time: 10/26/14 11:15 AM  Result Value Ref Range   WBC 5.0 4.0 - 10.5 K/uL   RBC 3.93 3.87 - 5.11 MIL/uL   Hemoglobin 11.8 (L) 12.0 - 15.0 g/dL   HCT 09.335.8 (L) 23.536.0 - 57.346.0 %   MCV 91.1 78.0 - 100.0 fL   MCH 30.0 26.0 - 34.0 pg   MCHC 33.0 30.0 - 36.0 g/dL   RDW 22.013.7 25.411.5 - 27.015.5 %   Platelets 197 150 - 400 K/uL    No results  found.  Assessment/Plan: [id number] with blighted ovum/missed ab scheduled today for suction D&C.  R/B/A discussed with patient including but not limited to bleeding, infection and injury.  Questions answered and consent signed and witnessed.  Purcell NailsROBERTS,Muhamed Luecke Y 10/26/2014, 11:33 AM

## 2014-10-26 NOTE — Anesthesia Postprocedure Evaluation (Signed)
  Anesthesia Post Note  Patient: Autumn Evans  Procedure(s) Performed: Procedure(s) (LRB): SUCTION DILATATION AND CURETTAGE   (N/A)  Anesthesia type: MAC  Patient location: PACU  Post pain: Pain level controlled  Post assessment: Post-op Vital signs reviewed  Last Vitals:  Filed Vitals:   10/26/14 1330  BP: 111/73  Pulse: 66  Temp:   Resp: 17    Post vital signs: Reviewed  Level of consciousness: sedated  Complications: No apparent anesthesia complications

## 2014-10-26 NOTE — Op Note (Signed)
Preop Diagnosis: Blighted Ovum/Missed Ab  Postop Diagnosis: Blighted Ovum/Missed Ab  Procedure: SUCTION DILATATION AND CURETTAGE   Anesthesia: Choice   Anesthesiologist: Brayton CavesFreeman Jackson, MD   Attending: Purcell NailsAngela Y Jeanclaude Wentworth, MD   Assistant: N/A  Findings: Moderate POCs  Pathology: POCs  Fluids: 600 cc  UOP: Pt voided prior to procedure and small amount via straight cath at time of procedure  EBL: Minimal  Complications: None  Procedure: The patient was taken to the operating room after the risks benefits and alternatives were discussed with the patient, the patient verbalized understanding and consent signed and witnessed.  The patient was placed under MAC anesthesia, prepped and draped in the normal sterile fashion and a time out was performed.  A bivalve speculum was placed in the patient's vagina and the anterior lip of the cervix grasped with a single-tooth tenaculum. A paracervical block was administered using a total of 10 cc of 2% lidocaine.  The uterus was sounded to 9 cm and a size 8 suction curette was used. Suction curettage was performed until minimal tissue returned. Sharp curettage was performed until a gritty texture was noted. Suction curettage was performed once again to remove any remaining debris. All instruments were removed. The count was correct. The patient was transferred to the recovery room in good condition.

## 2014-10-26 NOTE — Transfer of Care (Signed)
Immediate Anesthesia Transfer of Care Note  Patient: Autumn Evans  Procedure(s) Performed: Procedure(s): SUCTION DILATATION AND CURETTAGE   (N/A)  Patient Location: PACU  Anesthesia Type:MAC  Level of Consciousness: awake, alert  and oriented  Airway & Oxygen Therapy: Patient Spontanous Breathing  Post-op Assessment: Report given to RN and Post -op Vital signs reviewed and stable  Post vital signs: Reviewed and stable  Last Vitals:  Filed Vitals:   10/26/14 1330  BP: 111/73  Pulse: 66  Temp:   Resp: 17    Complications: No apparent anesthesia complications

## 2014-10-26 NOTE — Discharge Instructions (Signed)
DISCHARGE INSTRUCTIONS: D&C / D&E The following instructions have been prepared to help you care for yourself upon your return home.  MAY TAKE IBUPROFEN (MOTRIN, ADVIL) OR ALEVE AFTER 6:45 PM FOR CRAMPS! MAY TAKE OFF THE PATCH BEHIND YOUR EAR AFTER 48 HOURS!!   Personal hygiene:  Use sanitary pads for vaginal drainage, not tampons.  Shower the day after your procedure.  NO tub baths, pools or Jacuzzis for 2-3 weeks.  Wipe front to back after using the bathroom.  Activity and limitations:  Do NOT drive or operate any equipment for 24 hours. The effects of anesthesia are still present and drowsiness may result.  Do NOT rest in bed all day.  Walking is encouraged.  Walk up and down stairs slowly.  You may resume your normal activity in one to two days or as indicated by your physician.  Sexual activity: NO intercourse for at least 2 weeks after the procedure, or as indicated by your physician.  Diet: Eat a light meal as desired this evening. You may resume your usual diet tomorrow.  Return to work: You may resume your work activities in one to two days or as indicated by your doctor.  What to expect after your surgery: Expect to have vaginal bleeding/discharge for 2-3 days and spotting for up to 10 days. It is not unusual to have soreness for up to 1-2 weeks. You may have a slight burning sensation when you urinate for the first day. Mild cramps may continue for a couple of days. You may have a regular period in 2-6 weeks.  Call your doctor for any of the following:  Excessive vaginal bleeding, saturating and changing one pad every hour.  Inability to urinate 6 hours after discharge from hospital.  Pain not relieved by pain medication.  Fever of 100.4 F or greater.  Unusual vaginal discharge or odor.   Call for an appointment:    Patients signature: ______________________  Nurses signature ________________________  Support person's  signature_______________________

## 2014-10-27 ENCOUNTER — Encounter (HOSPITAL_COMMUNITY): Payer: Self-pay | Admitting: Obstetrics and Gynecology

## 2015-08-10 ENCOUNTER — Encounter (HOSPITAL_COMMUNITY): Payer: Self-pay | Admitting: *Deleted

## 2016-02-23 IMAGING — CR DG ANKLE COMPLETE 3+V*R*
3 series · 3 of 3 positions shown · non-contrast
Comparison: None.

CLINICAL DATA: Ankle twisting injury.  Pain.

EXAM:
RIGHT ANKLE - COMPLETE 3+ VIEW

[x ankle ap right]
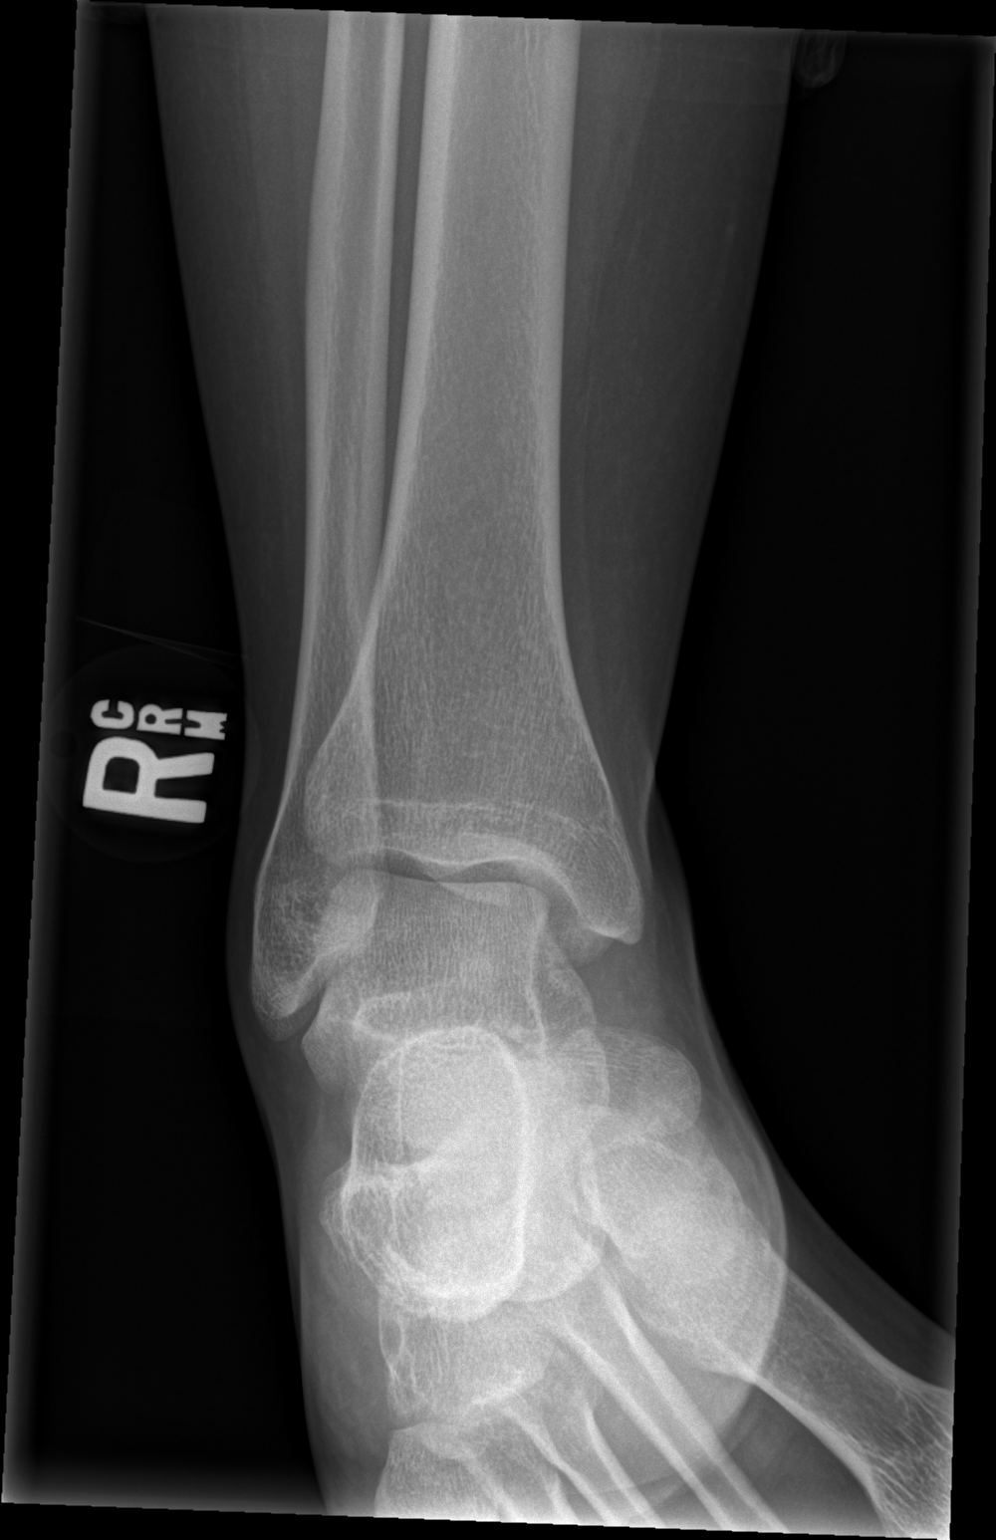

[x ankle obl right]
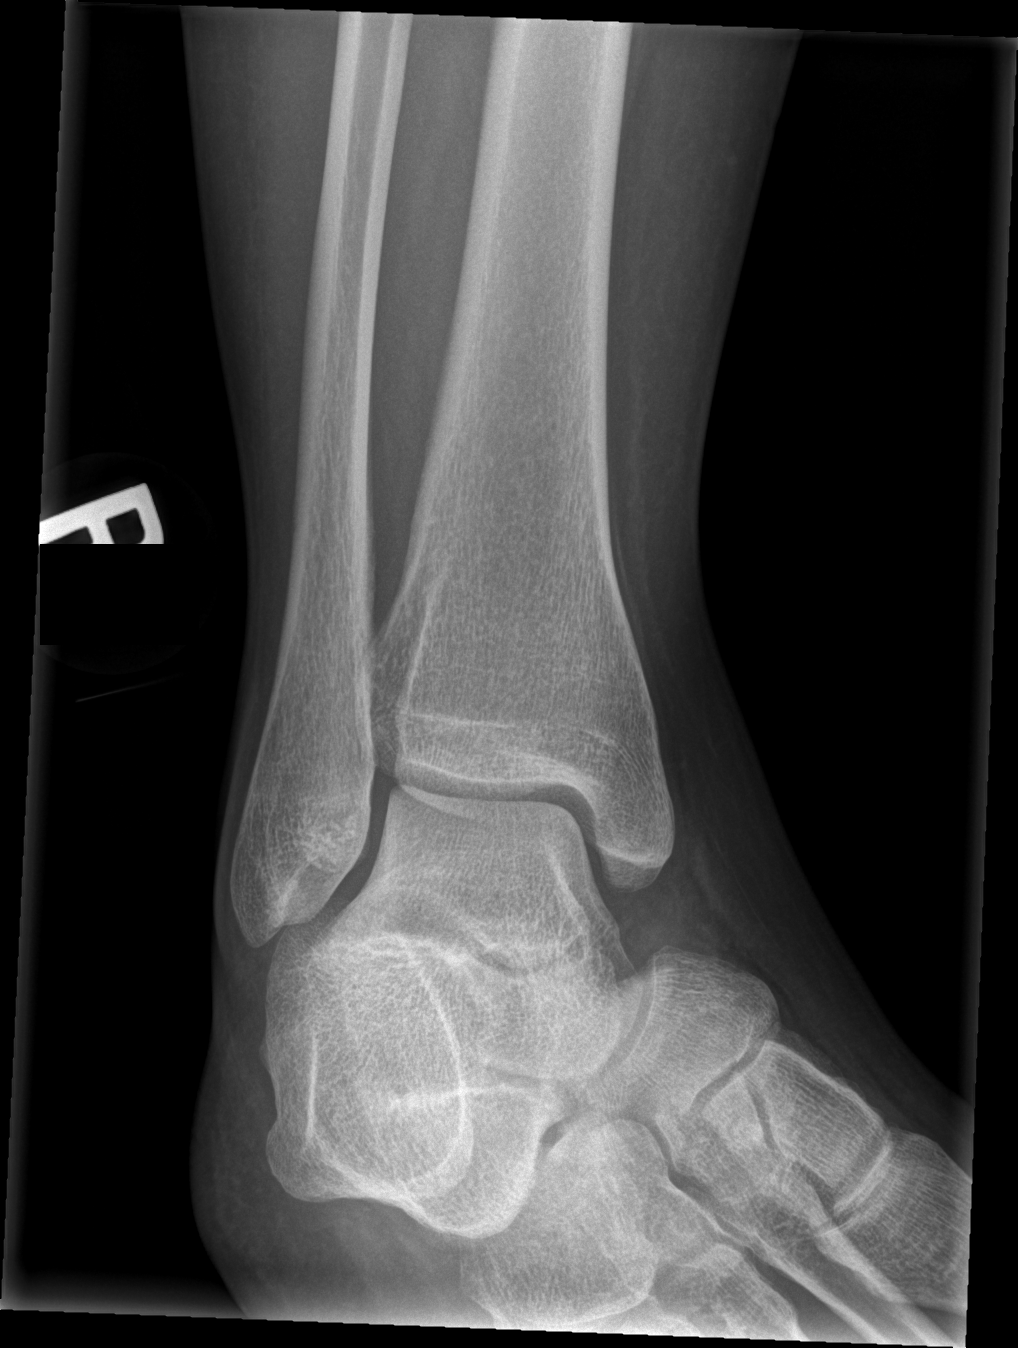

[x ankle lat right]
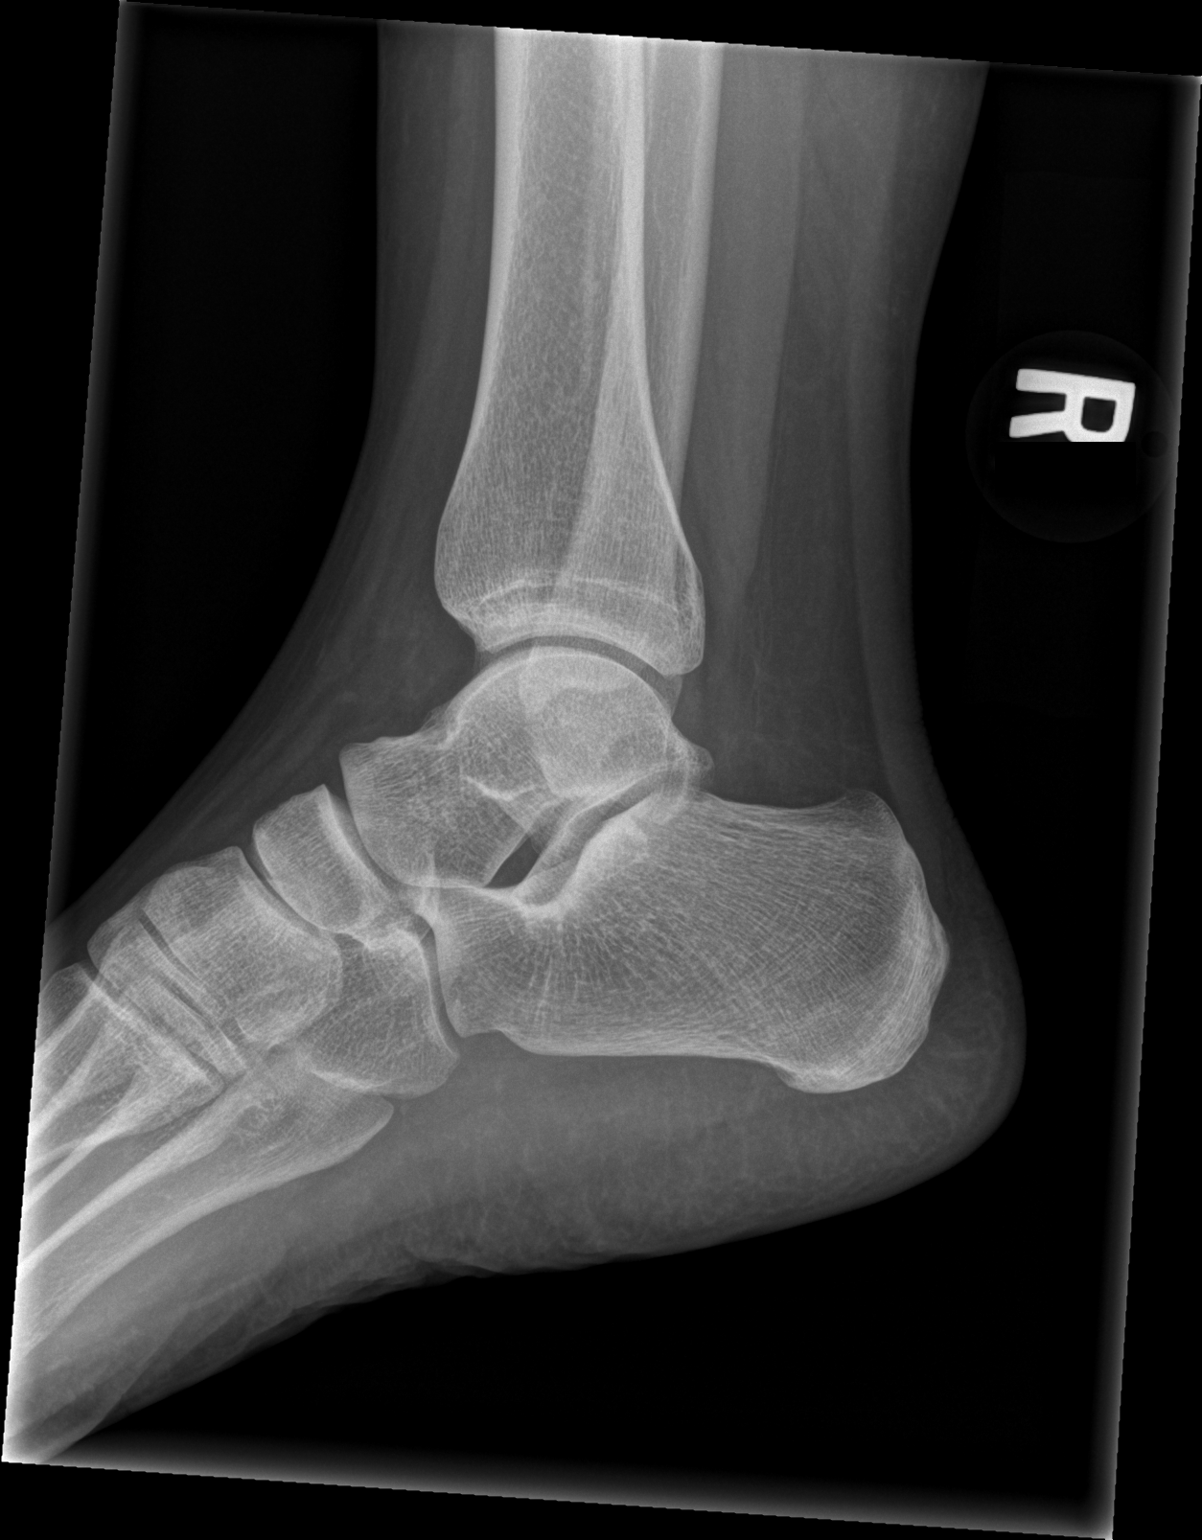

[3 of 3 positions shown; findings below may reference images not displayed]

FINDINGS: No fracture. Ankle mortise is normally spaced and aligned. Soft
tissues are unremarkable.
IMPRESSION: No fracture or joint abnormality.

## 2017-04-03 ENCOUNTER — Encounter: Payer: Self-pay | Admitting: Obstetrics and Gynecology

## 2017-04-03 ENCOUNTER — Ambulatory Visit (INDEPENDENT_AMBULATORY_CARE_PROVIDER_SITE_OTHER): Payer: BLUE CROSS/BLUE SHIELD | Admitting: Obstetrics and Gynecology

## 2017-04-03 VITALS — BP 120/80 | Ht 65.0 in | Wt 190.0 lb

## 2017-04-03 DIAGNOSIS — N760 Acute vaginitis: Secondary | ICD-10-CM | POA: Diagnosis not present

## 2017-04-03 DIAGNOSIS — Z30431 Encounter for routine checking of intrauterine contraceptive device: Secondary | ICD-10-CM

## 2017-04-03 DIAGNOSIS — Z113 Encounter for screening for infections with a predominantly sexual mode of transmission: Secondary | ICD-10-CM

## 2017-04-03 DIAGNOSIS — Z01419 Encounter for gynecological examination (general) (routine) without abnormal findings: Secondary | ICD-10-CM

## 2017-04-03 DIAGNOSIS — B9689 Other specified bacterial agents as the cause of diseases classified elsewhere: Secondary | ICD-10-CM | POA: Diagnosis not present

## 2017-04-03 NOTE — Progress Notes (Signed)
Chief Complaint  Patient presents with  . Gynecologic Exam     HPI:      [name] is a 31 y.o. G3P0011 who LMP was Patient's last menstrual period was 04/01/2017., presents today for her annual examination.  Her menses are regular every 28-30 days, lasting 3 days.  Dysmenorrhea none. She does not have intermenstrual bleeding.  Sex activity: single partner, contraception - IUD. Mirena 05/14/16. Pt wants gon/chlam testing today. Last Pap: April 02, 2016  Results were: no abnormalities /neg HPV DNA  Hx of STDs: chlamydia, HPV, trich  There is no FH of breast cancer. There is no FH of ovarian cancer. The patient does not do self-breast exams.  Tobacco use: The patient denies current or previous tobacco use. Alcohol use: none Exercise: not active  She does get adequate calcium and Vitamin D in her diet.  She has a hx of recurrent BV, no sx since 2/18 with cleocin tx at that time. She noticed an odor before her LMP but no sx today. One Swab 8/17 confirmed BV. She doesn't use condoms, no probiotic use, uses Dove sens skin soap, no dryer sheets.   Past Medical History:  Diagnosis Date  . Anemia   . Migraines    otc med prn  . SVD (spontaneous vaginal delivery)    x 1    Past Surgical History:  Procedure Laterality Date  . DILATION AND CURETTAGE OF UTERUS N/A 10/26/2014   Procedure: SUCTION DILATATION AND CURETTAGE  ;  Surgeon: Purcell NailsAngela Y Roberts, MD;  Location: [facility];  Service: Gynecology;  Laterality: N/A;  . WISDOM TOOTH EXTRACTION      Family History  Problem Relation Age of Onset  . Cancer Maternal Grandmother     Social History   Social History  . Marital status: Single    Spouse name: N/A  . Number of children: 1  . Years of education: masters   Occupational History  . [employer]    Social History Main Topics  . Smoking status: Never Smoker  . Smokeless tobacco: Never Used  . Alcohol use Yes     Comment: OCC.LAST TIME-    09-18-2013  . Drug use: No  . Sexual activity: Yes    Birth control/ protection: None     Comment: approx [redacted] wks gestation   Other Topics Concern  . Not on file   Social History Narrative   Patient lives at with her son and boyfriends, she drinks 4 soda's  A day.     Current Outpatient Prescriptions:  .  levonorgestrel (MIRENA) 20 MCG/24HR IUD, 1 each by Intrauterine route once., Disp: , Rfl:  .  ibuprofen (ADVIL,MOTRIN) 600 MG tablet, Take 1 tablet (600 mg total) by mouth every 6 (six) hours as needed. (Patient not taking: Reported on 04/03/2017), Disp: 30 tablet, Rfl: 1 .  oxyCODONE-acetaminophen (PERCOCET) 5-325 MG per tablet, Take 1-2 tablets by mouth every 6 (six) hours as needed for moderate pain or severe pain. (Patient not taking: Reported on 04/03/2017), Disp: 30 tablet, Rfl: 0  ROS:  Review of Systems  Constitutional: Negative for fatigue, fever and unexpected weight change.  Respiratory: Negative for cough, shortness of breath and wheezing.   Cardiovascular: Negative for chest pain, palpitations and leg swelling.  Gastrointestinal: Negative for blood in stool, constipation, diarrhea, nausea and vomiting.  Endocrine: Negative for cold intolerance, heat intolerance and polyuria.  Genitourinary: Negative for dyspareunia, dysuria, flank pain, frequency, genital sores, hematuria, menstrual problem,  pelvic pain, urgency, vaginal bleeding, vaginal discharge and vaginal pain.  Musculoskeletal: Negative for back pain, joint swelling and myalgias.  Skin: Negative for rash.  Neurological: Negative for dizziness, syncope, light-headedness, numbness and headaches.  Hematological: Negative for adenopathy.  Psychiatric/Behavioral: Negative for agitation, confusion, sleep disturbance and suicidal ideas. The patient is not nervous/anxious.      Objective: BP 120/80   Ht 5\' 5"  (1.651 m)   Wt 190 lb (86.2 kg)   LMP 04/01/2017   BMI 31.62 kg/m    Physical Exam  Constitutional: She is  oriented to person, place, and time. She appears well-developed and well-nourished.  Genitourinary: Vagina normal and uterus normal. There is no rash or tenderness on the right labia. There is no rash or tenderness on the left labia. No erythema or tenderness in the vagina. No vaginal discharge found. Right adnexum does not display mass and does not display tenderness. Left adnexum does not display mass and does not display tenderness.  Cervix exhibits visible IUD strings. Cervix does not exhibit motion tenderness or polyp. Uterus is not enlarged or tender.  Neck: Normal range of motion. No thyromegaly present.  Cardiovascular: Normal rate, regular rhythm and normal heart sounds.   No murmur heard. Pulmonary/Chest: Effort normal and breath sounds normal. Right breast exhibits no mass, no nipple discharge, no skin change and no tenderness. Left breast exhibits no mass, no nipple discharge, no skin change and no tenderness.  Abdominal: Soft. There is no tenderness. There is no guarding.  Musculoskeletal: Normal range of motion.  Neurological: She is alert and oriented to person, place, and time. No cranial nerve deficit.  Psychiatric: She has a normal mood and affect. Her behavior is normal.  Vitals reviewed.   Assessment/Plan: Encounter for annual routine gynecological examination  Encounter for routine checking of intrauterine contraceptive device (IUD) - IUD in place  Screening for STD (sexually transmitted disease) - Plan: Chlamydia/Gonococcus/Trichomonas, NAA  Bacterial vaginosis - Hx of BV in past, no sx today but noticed odor a few days ago. Call if sx persist after menses for BV tx. Add probiotics/use condoms/dove sens skin soap.             GYN counsel STD prevention, adequate intake of calcium and vitamin D     F/U  Return in about 1 year (around 04/03/2018).  Melody Cirrincione B. Rhylie Stehr, PA-C 04/03/2017 9:02 AM

## 2017-08-15 ENCOUNTER — Telehealth: Payer: Self-pay

## 2017-08-15 ENCOUNTER — Other Ambulatory Visit: Payer: Self-pay | Admitting: Obstetrics and Gynecology

## 2017-08-15 ENCOUNTER — Other Ambulatory Visit: Payer: Self-pay

## 2017-08-15 MED ORDER — CLINDAMYCIN PHOSPHATE 2 % VA CREA: 1.0000 | TOPICAL_CREAM | Freq: Every day | VAGINAL | 0 refills | Status: DC

## 2017-08-15 NOTE — Progress Notes (Signed)
Rx cleocin for BV sx. Hx of recurrent sx.

## 2017-08-15 NOTE — Telephone Encounter (Signed)
[facility] [place] RN to notify pt.

## 2017-08-15 NOTE — Telephone Encounter (Signed)
[facility] [place]

## 2017-08-15 NOTE — Telephone Encounter (Signed)
Pt states she has BV symptom's if [facility] could call in a rx

## 2018-02-19 ENCOUNTER — Telehealth: Payer: Self-pay

## 2018-02-19 NOTE — Telephone Encounter (Signed)
Pt has been treated by [facility] in the past for BV. She was given the cream. She doesn't like that anymore. She is requesting metronidazole. Pt inquiring if she needs apt or if rx can be sent in. No details given on s&s. [facility] [place]

## 2018-02-20 ENCOUNTER — Other Ambulatory Visit: Payer: Self-pay | Admitting: Obstetrics and Gynecology

## 2018-02-20 DIAGNOSIS — B9689 Other specified bacterial agents as the cause of diseases classified elsewhere: Secondary | ICD-10-CM

## 2018-02-20 DIAGNOSIS — N760 Acute vaginitis: Principal | ICD-10-CM

## 2018-02-20 MED ORDER — METRONIDAZOLE 500 MG PO TABS: 500.0000 mg | ORAL_TABLET | Freq: Two times a day (BID) | ORAL | 0 refills | Status: AC

## 2018-02-20 NOTE — Telephone Encounter (Signed)
Rx flagyl eRxd. No alcohol use. F/u prn. Due for annual 8/19.

## 2018-02-20 NOTE — Telephone Encounter (Signed)
Pt aware of rx and scheduled for annual 04/21/18

## 2018-02-20 NOTE — Progress Notes (Signed)
Rx RF flagyl for BV sx. F/u prn.  

## 2018-02-20 NOTE — Telephone Encounter (Signed)
Please advise 

## 2018-04-21 ENCOUNTER — Encounter: Payer: Self-pay | Admitting: Obstetrics and Gynecology

## 2018-04-21 ENCOUNTER — Other Ambulatory Visit (HOSPITAL_COMMUNITY)
Admission: RE | Admit: 2018-04-21 | Discharge: 2018-04-21 | Disposition: A | Discharge status: Home / Self Care | Payer: BLUE CROSS/BLUE SHIELD | Source: Ambulatory Visit | Attending: Obstetrics and Gynecology | Admitting: Obstetrics and Gynecology

## 2018-04-21 ENCOUNTER — Ambulatory Visit (INDEPENDENT_AMBULATORY_CARE_PROVIDER_SITE_OTHER): Payer: BLUE CROSS/BLUE SHIELD | Admitting: Obstetrics and Gynecology

## 2018-04-21 VITALS — BP 100/70 | Ht 65.0 in | Wt 193.0 lb

## 2018-04-21 DIAGNOSIS — B9689 Other specified bacterial agents as the cause of diseases classified elsewhere: Secondary | ICD-10-CM

## 2018-04-21 DIAGNOSIS — N760 Acute vaginitis: Secondary | ICD-10-CM

## 2018-04-21 DIAGNOSIS — Z01411 Encounter for gynecological examination (general) (routine) with abnormal findings: Secondary | ICD-10-CM

## 2018-04-21 DIAGNOSIS — Z01419 Encounter for gynecological examination (general) (routine) without abnormal findings: Secondary | ICD-10-CM

## 2018-04-21 DIAGNOSIS — Z113 Encounter for screening for infections with a predominantly sexual mode of transmission: Secondary | ICD-10-CM | POA: Insufficient documentation

## 2018-04-21 LAB — POCT WET PREP WITH KOH
Clue Cells Wet Prep HPF POC: POSITIVE
KOH Prep POC: POSITIVE — AB
Trichomonas, UA: NEGATIVE
Yeast Wet Prep HPF POC: NEGATIVE

## 2018-04-21 MED ORDER — CLINDAMYCIN HCL 300 MG PO CAPS: 300.0000 mg | ORAL_CAPSULE | Freq: Two times a day (BID) | ORAL | 0 refills | Status: AC

## 2018-04-21 NOTE — Progress Notes (Signed)
PCP:  Farris Has, MD   Chief Complaint  Patient presents with  . Gynecologic Exam    pt would like std screening     HPI:      Autumn Evans is a 32 y.o. G3P0011 who LMP was Patient's last menstrual period was 04/13/2018., presents today for her annual examination.  Her menses are regular every 28-30 days, lasting 3 days, light spotting with IUD.  Dysmenorrhea none. She does not have intermenstrual bleeding.  Sex activity: not sexually active recently. Mirena placed 05/14/16. Wants STD testing. Hx of recurrent BV with atopobium, BVAB2, megasphaera on 8/17 One Swab. Treated with clindamycin crm in past but prefers oral flagyl. Last treated with flagyl 6/19 with sx relief, but sx recurred again.  Last Pap: April 02, 2016  Results were: no abnormalities /neg HPV DNA  Hx of STDs: chlamydia, HPV, trich  There is no FH of breast cancer. There is no FH of ovarian cancer. The patient does do self-breast exams.  Tobacco use: The patient denies current or previous tobacco use. Alcohol use: social drinker No drug use.  Exercise: moderately active  She does get adequate calcium but not Vitamin D in her diet.   Past Medical History:  Diagnosis Date  . Anemia   . Migraines    otc med prn  . SVD (spontaneous vaginal delivery)    x 1    Past Surgical History:  Procedure Laterality Date  . DILATION AND CURETTAGE OF UTERUS N/A 10/26/2014   Procedure: SUCTION DILATATION AND CURETTAGE  ;  Surgeon: Purcell Nails, MD;  Location: [facility];  Service: Gynecology;  Laterality: N/A;  . WISDOM TOOTH EXTRACTION      Family History  Problem Relation Age of Onset  . Cancer Maternal Grandmother     Social History   Socioeconomic History  . Marital status: Single    Spouse name: Not on file  . Number of children: 1  . Years of education: masters  . Highest education level: Not on file  Occupational History  . Occupation: [employer]  Social Needs  . Financial  resource strain: Not on file  . Food insecurity:    Worry: Not on file    Inability: Not on file  . Transportation needs:    Medical: Not on file    Non-medical: Not on file  Tobacco Use  . Smoking status: Never Smoker  . Smokeless tobacco: Never Used  Substance and Sexual Activity  . Alcohol use: Yes    Comment: socially  . Drug use: No  . Sexual activity: Yes    Birth control/protection: IUD    Comment: mirena  Lifestyle  . Physical activity:    Days per week: Not on file    Minutes per session: Not on file  . Stress: Not on file  Relationships  . Social connections:    Talks on phone: Not on file    Gets together: Not on file    Attends religious service: Not on file    Active member of club or organization: Not on file    Attends meetings of clubs or organizations: Not on file    Relationship status: Not on file  . Intimate partner violence:    Fear of current or ex partner: Not on file    Emotionally abused: Not on file    Physically abused: Not on file    Forced sexual activity: Not on file  Other Topics Concern  .  Not on file  Social History Narrative   Patient lives at with her son and boyfriends, she drinks 4 soda's  A day.    Outpatient Medications Prior to Visit  Medication Sig Dispense Refill  . levonorgestrel (MIRENA) 20 MCG/24HR IUD 1 each by Intrauterine route once.    . clindamycin (CLEOCIN) 2 % vaginal cream Place 1 Applicatorful vaginally at bedtime. 40 g 0  . ibuprofen (ADVIL,MOTRIN) 600 MG tablet Take 1 tablet (600 mg total) by mouth every 6 (six) hours as needed. (Patient not taking: Reported on 04/03/2017) 30 tablet 1  . oxyCODONE-acetaminophen (PERCOCET) 5-325 MG per tablet Take 1-2 tablets by mouth every 6 (six) hours as needed for moderate pain or severe pain. (Patient not taking: Reported on 04/03/2017) 30 tablet 0   No facility-administered medications prior to visit.     ROS:  Review of Systems  Constitutional: Negative for fatigue, fever  and unexpected weight change.  Respiratory: Negative for cough, shortness of breath and wheezing.   Cardiovascular: Negative for chest pain, palpitations and leg swelling.  Gastrointestinal: Negative for blood in stool, constipation, diarrhea, nausea and vomiting.  Endocrine: Negative for cold intolerance, heat intolerance and polyuria.  Genitourinary: Positive for vaginal discharge. Negative for dyspareunia, dysuria, flank pain, frequency, genital sores, hematuria, menstrual problem, pelvic pain, urgency, vaginal bleeding and vaginal pain.  Musculoskeletal: Negative for back pain, joint swelling and myalgias.  Skin: Negative for rash.  Neurological: Negative for dizziness, syncope, light-headedness, numbness and headaches.  Hematological: Negative for adenopathy.  Psychiatric/Behavioral: Negative for agitation, confusion, sleep disturbance and suicidal ideas. The patient is not nervous/anxious.   BREAST: No symptoms   Objective: BP 100/70   Ht 5\' 5"  (1.651 m)   Wt 193 lb (87.5 kg)   LMP 04/13/2018 Comment: Mirena  Breastfeeding? No   BMI 32.12 kg/m    Physical Exam  Constitutional: She is oriented to person, place, and time. She appears well-developed and well-nourished.  Genitourinary: Vagina normal and uterus normal. There is no rash or tenderness on the right labia. There is no rash or tenderness on the left labia. No erythema or tenderness in the vagina. No vaginal discharge found. Right adnexum does not display mass and does not display tenderness. Left adnexum does not display mass and does not display tenderness.  Cervix exhibits visible IUD strings. Cervix does not exhibit motion tenderness or polyp. Uterus is not enlarged or tender.  Neck: Normal range of motion. No thyromegaly present.  Cardiovascular: Normal rate, regular rhythm and normal heart sounds.  No murmur heard. Pulmonary/Chest: Effort normal and breath sounds normal. Right breast exhibits no mass, no nipple  discharge, no skin change and no tenderness. Left breast exhibits no mass, no nipple discharge, no skin change and no tenderness.  Abdominal: Soft. There is no tenderness. There is no guarding.  Musculoskeletal: Normal range of motion.  Neurological: She is alert and oriented to person, place, and time. No cranial nerve deficit.  Psychiatric: She has a normal mood and affect. Her behavior is normal.  Vitals reviewed.   Results: Results for orders placed or performed in visit on 04/21/18 (from the past 24 hour(s))  POCT Wet Prep with KOH     Status: Abnormal   Collection Time: 04/21/18  9:29 AM  Result Value Ref Range   Trichomonas, UA Negative    Clue Cells Wet Prep HPF POC pos    Epithelial Wet Prep HPF POC     Yeast Wet Prep HPF POC neg  Bacteria Wet Prep HPF POC     RBC Wet Prep HPF POC     WBC Wet Prep HPF POC     KOH Prep POC Positive (A) Negative    Assessment/Plan: Encounter for annual routine gynecological examination  Screening for STD (sexually transmitted disease) - Plan: Cervicovaginal ancillary only  Bacterial vaginosis - Pos wet prep/sx. Rx clindamycin. May need maint if sx recur. Would do cleocin crm qwk, and pt ok wiht that. - Plan: clindamycin (CLEOCIN) 300 MG capsule, POCT Wet Prep with KOH  Meds ordered this encounter  Medications  . clindamycin (CLEOCIN) 300 MG capsule    Sig: Take 1 capsule (300 mg total) by mouth 2 (two) times daily for 7 days.    Dispense:  14 capsule    Refill:  0    Order Specific Question:   Supervising Provider    Answer:   Nadara MustardHARRIS, ROBERT P [[id number]]             GYN counsel adequate intake of calcium and vitamin D, diet and exercise     F/U  Return in about 1 year (around 04/22/2019).  Autumn Foutz B. Rock Sobol, PA-C 04/21/2018 9:31 AM

## 2018-04-21 NOTE — Patient Instructions (Signed)
I value your feedback and entrusting us with your care. If you get a [facility] patient survey, I would appreciate you taking the time to let us know about your experience today. Thank you! 

## 2018-04-22 LAB — CERVICOVAGINAL ANCILLARY ONLY
Chlamydia: NEGATIVE
Neisseria Gonorrhea: NEGATIVE
Trichomonas: NEGATIVE

## 2018-04-25 ENCOUNTER — Telehealth: Payer: Self-pay

## 2018-04-25 NOTE — Telephone Encounter (Signed)
Pt unable to see all of her results on MyChart.  Wants to be sure they are all back.  [phone]  Pt aware they are all back and are negative.

## 2018-04-30 ENCOUNTER — Other Ambulatory Visit: Payer: Self-pay | Admitting: Obstetrics and Gynecology

## 2018-04-30 ENCOUNTER — Telehealth: Payer: Self-pay

## 2018-04-30 MED ORDER — FLUCONAZOLE 150 MG PO TABS: 150.0000 mg | ORAL_TABLET | Freq: Once | ORAL | 0 refills | Status: AC

## 2018-04-30 NOTE — Telephone Encounter (Signed)
Pt reports she was recently rx'd BV meds which she has completed. She now has a YI as she usually gets after taking BV meds. [facility] [place] [phone]

## 2018-04-30 NOTE — Telephone Encounter (Signed)
Rx diflucan eRxd. RN to notify pt.

## 2018-04-30 NOTE — Progress Notes (Signed)
Rx for yeast vag after BV tx

## 2018-04-30 NOTE — Telephone Encounter (Signed)
Pt aware.

## 2018-05-12 DIAGNOSIS — Z113 Encounter for screening for infections with a predominantly sexual mode of transmission: Secondary | ICD-10-CM | POA: Diagnosis not present

## 2018-05-26 DIAGNOSIS — Z113 Encounter for screening for infections with a predominantly sexual mode of transmission: Secondary | ICD-10-CM | POA: Diagnosis not present

## 2018-06-26 DIAGNOSIS — Z113 Encounter for screening for infections with a predominantly sexual mode of transmission: Secondary | ICD-10-CM | POA: Diagnosis not present

## 2018-06-26 DIAGNOSIS — N760 Acute vaginitis: Secondary | ICD-10-CM | POA: Diagnosis not present

## 2018-06-30 DIAGNOSIS — Z23 Encounter for immunization: Secondary | ICD-10-CM | POA: Diagnosis not present

## 2018-09-10 ENCOUNTER — Telehealth: Payer: Self-pay

## 2018-09-10 NOTE — Telephone Encounter (Signed)
Pt calling for rx for yeast inf. [phone] Pt aware needs to be seen or she can try monistat first.  States she doesn't like using a cream.  She will call back to schedule.

## 2018-09-15 ENCOUNTER — Ambulatory Visit: Payer: BLUE CROSS/BLUE SHIELD | Admitting: Obstetrics and Gynecology

## 2018-09-15 ENCOUNTER — Other Ambulatory Visit (HOSPITAL_COMMUNITY)
Admission: RE | Admit: 2018-09-15 | Discharge: 2018-09-15 | Disposition: A | Discharge status: Home / Self Care | Payer: BLUE CROSS/BLUE SHIELD | Source: Ambulatory Visit | Attending: Obstetrics and Gynecology | Admitting: Obstetrics and Gynecology

## 2018-09-15 ENCOUNTER — Encounter: Payer: Self-pay | Admitting: Obstetrics and Gynecology

## 2018-09-15 VITALS — BP 118/80 | HR 74 | Ht 65.0 in | Wt 196.0 lb

## 2018-09-15 DIAGNOSIS — B373 Candidiasis of vulva and vagina: Secondary | ICD-10-CM | POA: Diagnosis not present

## 2018-09-15 DIAGNOSIS — Z113 Encounter for screening for infections with a predominantly sexual mode of transmission: Secondary | ICD-10-CM

## 2018-09-15 DIAGNOSIS — N898 Other specified noninflammatory disorders of vagina: Secondary | ICD-10-CM

## 2018-09-15 DIAGNOSIS — B3731 Acute candidiasis of vulva and vagina: Secondary | ICD-10-CM

## 2018-09-15 LAB — POCT WET PREP WITH KOH
CLUE CELLS WET PREP PER HPF POC: NEGATIVE
KOH Prep POC: NEGATIVE
TRICHOMONAS UA: NEGATIVE
Yeast Wet Prep HPF POC: NEGATIVE

## 2018-09-15 MED ORDER — FLUCONAZOLE 150 MG PO TABS: 150.0000 mg | ORAL_TABLET | Freq: Once | ORAL | 0 refills | Status: AC

## 2018-09-15 NOTE — Patient Instructions (Signed)
I value your feedback and entrusting us with your care. If you get a [facility] patient survey, I would appreciate you taking the time to let us know about your experience today. Thank you! 

## 2018-09-15 NOTE — Progress Notes (Signed)
Farris Has, MD   Chief Complaint  Patient presents with  . STD screening    having vag discharge, little itching, no odor x 5 days had BV pills on last period  . Follow-up    medication    HPI:      [name] is a 33 y.o. G3P0011 who LMP was Patient's last menstrual period was 08/28/2018 (approximate)., presents today for increased d/c with irritation, no odor, for about 5 days. Treated with flagyl for BV a few wks ago, and this gives pt yeast vag sx. Hx of recurrent yeast in past, including atopobium on culture. No longer taking probiotics. Did boric acid supp years ago.  No meds to treat yeast. No LBP, belly pain, fevers. Also wants STD testing, no known exposures. No urin sx.   Has IUD, current on annual.   Past Medical History:  Diagnosis Date  . Anemia   . Migraines    otc med prn  . SVD (spontaneous vaginal delivery)    x 1    Past Surgical History:  Procedure Laterality Date  . COLPOSCOPY  2004   neg paps since  . DILATION AND CURETTAGE OF UTERUS N/A 10/26/2014   Procedure: SUCTION DILATATION AND CURETTAGE  ;  Surgeon: Purcell Nails, MD;  Location: [facility];  Service: Gynecology;  Laterality: N/A;  . WISDOM TOOTH EXTRACTION      Family History  Problem Relation Age of Onset  . Cancer Maternal Grandmother        throat    Social History   Socioeconomic History  . Marital status: Single    Spouse name: Not on file  . Number of children: 1  . Years of education: masters  . Highest education level: Not on file  Occupational History  . Occupation: [employer]  Social Needs  . Financial resource strain: Not on file  . Food insecurity:    Worry: Not on file    Inability: Not on file  . Transportation needs:    Medical: Not on file    Non-medical: Not on file  Tobacco Use  . Smoking status: Never Smoker  . Smokeless tobacco: Never Used  Substance and Sexual Activity  . Alcohol use: Yes    Comment: socially  . Drug  use: No  . Sexual activity: Yes    Birth control/protection: I.U.D.    Comment: mirena  Lifestyle  . Physical activity:    Days per week: Not on file    Minutes per session: Not on file  . Stress: Not on file  Relationships  . Social connections:    Talks on phone: Not on file    Gets together: Not on file    Attends religious service: Not on file    Active member of club or organization: Not on file    Attends meetings of clubs or organizations: Not on file    Relationship status: Not on file  . Intimate partner violence:    Fear of current or ex partner: Not on file    Emotionally abused: Not on file    Physically abused: Not on file    Forced sexual activity: Not on file  Other Topics Concern  . Not on file  Social History Narrative   Patient lives at with her son and boyfriends, she drinks 4 soda's  A day.    Outpatient Medications Prior to Visit  Medication Sig Dispense Refill  . levonorgestrel (MIRENA) 20 MCG/24HR  IUD 1 each by Intrauterine route once.     No facility-administered medications prior to visit.       ROS:  Review of Systems  Constitutional: Negative for fever.  Gastrointestinal: Negative for blood in stool, constipation, diarrhea, nausea and vomiting.  Genitourinary: Positive for vaginal discharge. Negative for dyspareunia, dysuria, flank pain, frequency, hematuria, urgency, vaginal bleeding and vaginal pain.  Musculoskeletal: Negative for back pain.  Skin: Negative for rash.    OBJECTIVE:   Vitals:  BP 118/80   Pulse 74   Ht 5\' 5"  (1.651 m)   Wt 196 lb (88.9 kg)   LMP 08/28/2018 (Approximate)   BMI 32.62 kg/m   Physical Exam Vitals signs reviewed.  Constitutional:      Appearance: She is well-developed.  Pulmonary:     Effort: Pulmonary effort is normal.  Genitourinary:    Pubic Area: No rash.      Labia:        Right: No rash, tenderness or lesion.        Left: No rash, tenderness or lesion.      Vagina: Vaginal discharge  present. No erythema or tenderness.     Cervix: Normal.     Uterus: Normal. Not enlarged and not tender.      Adnexa: Right adnexa normal and left adnexa normal.       Right: No mass or tenderness.         Left: No mass or tenderness.    Musculoskeletal: Normal range of motion.  Neurological:     Mental Status: She is alert and oriented to person, place, and time.  Psychiatric:        Behavior: Behavior normal.        Thought Content: Thought content normal.     Results: Results for orders placed or performed in visit on 09/15/18 (from the past 24 hour(s))  POCT Wet Prep with KOH     Status: Normal   Collection Time: 09/15/18 11:06 AM  Result Value Ref Range   Trichomonas, UA Negative    Clue Cells Wet Prep HPF POC neg    Epithelial Wet Prep HPF POC     Yeast Wet Prep HPF POC neg    Bacteria Wet Prep HPF POC     RBC Wet Prep HPF POC     WBC Wet Prep HPF POC     KOH Prep POC Negative Negative     Assessment/Plan: Candidal vaginitis - Neg wet prep/pos sx and exam. Rx diflucan. F/u prn.  - Plan: POCT Wet Prep with KOH, fluconazole (DIFLUCAN) 150 MG tablet  Vaginal discharge - No BV on wet prep. Given recurrence, start boric acid supp and restart probiotics.  - Plan: Cervicovaginal ancillary only  Screening for STD (sexually transmitted disease) - Plan: Cervicovaginal ancillary only    Meds ordered this encounter  Medications  . fluconazole (DIFLUCAN) 150 MG tablet    Sig: Take 1 tablet (150 mg total) by mouth once for 1 dose.    Dispense:  1 tablet    Refill:  0    Order Specific Question:   Supervising Provider    Answer:   Nadara MustardHARRIS, ROBERT P [[id number]]      Return if symptoms worsen or fail to improve.  Alicia B. Copland, PA-C 09/15/2018 11:08 AM

## 2018-09-16 LAB — CERVICOVAGINAL ANCILLARY ONLY
Chlamydia: NEGATIVE
Neisseria Gonorrhea: NEGATIVE
Trichomonas: NEGATIVE

## 2018-12-26 DIAGNOSIS — Z113 Encounter for screening for infections with a predominantly sexual mode of transmission: Secondary | ICD-10-CM | POA: Diagnosis not present

## 2018-12-26 DIAGNOSIS — N760 Acute vaginitis: Secondary | ICD-10-CM | POA: Diagnosis not present

## 2019-03-16 DIAGNOSIS — N761 Subacute and chronic vaginitis: Secondary | ICD-10-CM | POA: Diagnosis not present

## 2019-03-16 DIAGNOSIS — Z113 Encounter for screening for infections with a predominantly sexual mode of transmission: Secondary | ICD-10-CM | POA: Diagnosis not present

## 2019-03-20 ENCOUNTER — Other Ambulatory Visit: Payer: Self-pay

## 2019-03-20 DIAGNOSIS — Z20822 Contact with and (suspected) exposure to covid-19: Secondary | ICD-10-CM

## 2019-03-24 LAB — NOVEL CORONAVIRUS, NAA: SARS-CoV-2, NAA: NOT DETECTED

## 2019-05-27 DIAGNOSIS — F331 Major depressive disorder, recurrent, moderate: Secondary | ICD-10-CM | POA: Diagnosis not present

## 2019-06-08 DIAGNOSIS — F331 Major depressive disorder, recurrent, moderate: Secondary | ICD-10-CM | POA: Diagnosis not present

## 2019-06-15 DIAGNOSIS — F331 Major depressive disorder, recurrent, moderate: Secondary | ICD-10-CM | POA: Diagnosis not present

## 2019-06-29 DIAGNOSIS — F331 Major depressive disorder, recurrent, moderate: Secondary | ICD-10-CM | POA: Diagnosis not present

## 2019-07-07 DIAGNOSIS — Z113 Encounter for screening for infections with a predominantly sexual mode of transmission: Secondary | ICD-10-CM | POA: Diagnosis not present

## 2019-07-07 DIAGNOSIS — N760 Acute vaginitis: Secondary | ICD-10-CM | POA: Diagnosis not present

## 2019-07-15 DIAGNOSIS — F331 Major depressive disorder, recurrent, moderate: Secondary | ICD-10-CM | POA: Diagnosis not present

## 2019-07-20 DIAGNOSIS — J3489 Other specified disorders of nose and nasal sinuses: Secondary | ICD-10-CM | POA: Diagnosis not present

## 2019-07-20 DIAGNOSIS — Z20828 Contact with and (suspected) exposure to other viral communicable diseases: Secondary | ICD-10-CM | POA: Diagnosis not present

## 2019-07-21 DIAGNOSIS — N760 Acute vaginitis: Secondary | ICD-10-CM | POA: Diagnosis not present

## 2019-07-21 DIAGNOSIS — Z113 Encounter for screening for infections with a predominantly sexual mode of transmission: Secondary | ICD-10-CM | POA: Diagnosis not present

## 2019-08-04 DIAGNOSIS — F331 Major depressive disorder, recurrent, moderate: Secondary | ICD-10-CM | POA: Diagnosis not present

## 2019-08-11 NOTE — Progress Notes (Signed)
PCP:  London Pepper, MD   Chief Complaint  Patient presents with  . Gynecologic Exam  . Vaginal odor    fishy, no itchiness/irritation right before period started     HPI:      [name] is a 33 y.o. G3P0011 who LMP was Patient's last menstrual period was 08/08/2019 (approximate)., presents today for her annual examination.  Her menses are regular random with IUD, lasting 4-5 days, light spotting to mod flow with IUD.  Dysmenorrhea none. She does not have intermenstrual bleeding. Gets headaches with bleeding, improved with NSAIDs.  Sex activity: sexually active--contraception Mirena, placed 05/14/16. Wants STD testing.  Has BV sx again. [facility]. Treated with flagyl without  relief, so changed to metrogel. Still having sx.  Hx of recurrent BV with atopobium, BVAB2, megasphaera on 8/17 One Swab. Treated with clindamycin crm in past but preferred oral flagyl. Treated for BV and yeast 1/20. Instructed to start boric acid supp/probiotics; doing probiotics but not boric acid supp. Uses dove sens skin soap, no dryer sheets, no bubble baths or wipes. Wears cotton underwear, limited thong use.    Last Pap: April 02, 2016  Results were: no abnormalities /neg HPV DNA  Hx of STDs: chlamydia, HPV, trich; neg STD testing 1/20  There is no FH of breast cancer. There is no FH of ovarian cancer. The patient does do self-breast exams.  Tobacco use: The patient denies current or previous tobacco use. Alcohol use: social drinker No drug use.  Exercise: moderately active  She does get adequate calcium but not Vitamin D in her diet.   Past Medical History:  Diagnosis Date  . Anemia   . Migraines    otc med prn  . SVD (spontaneous vaginal delivery)    x 1    Past Surgical History:  Procedure Laterality Date  . COLPOSCOPY  2004   neg paps since  . DILATION AND CURETTAGE OF UTERUS N/A 10/26/2014   Procedure: SUCTION DILATATION AND CURETTAGE  ;  Surgeon:  Delice Lesch, MD;  Location: [facility];  Service: Gynecology;  Laterality: N/A;  . WISDOM TOOTH EXTRACTION      Family History  Problem Relation Age of Onset  . Cancer Maternal Grandmother        throat    Social History   Socioeconomic History  . Marital status: Single    Spouse name: Not on file  . Number of children: 1  . Years of education: masters  . Highest education level: Not on file  Occupational History  . Occupation: [employer]  Social Needs  . Financial resource strain: Not on file  . Food insecurity    Worry: Not on file    Inability: Not on file  . Transportation needs    Medical: Not on file    Non-medical: Not on file  Tobacco Use  . Smoking status: Never Smoker  . Smokeless tobacco: Never Used  Substance and Sexual Activity  . Alcohol use: Yes    Comment: socially  . Drug use: No  . Sexual activity: Yes    Birth control/protection: I.U.D.    Comment: mirena  Lifestyle  . Physical activity    Days per week: Not on file    Minutes per session: Not on file  . Stress: Not on file  Relationships  . Social Herbalist on phone: Not on file    Gets together: Not on file  Attends religious service: Not on file    Active member of club or organization: Not on file    Attends meetings of clubs or organizations: Not on file    Relationship status: Not on file  . Intimate partner violence    Fear of current or ex partner: Not on file    Emotionally abused: Not on file    Physically abused: Not on file    Forced sexual activity: Not on file  Other Topics Concern  . Not on file  Social History Narrative   Patient lives at with her son and boyfriends, she drinks 4 soda's  A day.    Outpatient Medications Prior to Visit  Medication Sig Dispense Refill  . levonorgestrel (MIRENA) 20 MCG/24HR IUD 1 each by Intrauterine route once.    . metroNIDAZOLE (METROGEL) 0.75 % vaginal gel SMARTSIG:1 Applicator Vaginal Once a Week      No facility-administered medications prior to visit.     ROS:  Review of Systems  Constitutional: Negative for fatigue, fever and unexpected weight change.  Respiratory: Negative for cough, shortness of breath and wheezing.   Cardiovascular: Negative for chest pain, palpitations and leg swelling.  Gastrointestinal: Negative for blood in stool, constipation, diarrhea, nausea and vomiting.  Endocrine: Negative for cold intolerance, heat intolerance and polyuria.  Genitourinary: Negative for dyspareunia, dysuria, flank pain, frequency, genital sores, hematuria, menstrual problem, pelvic pain, urgency, vaginal bleeding, vaginal discharge and vaginal pain.  Musculoskeletal: Negative for back pain, joint swelling and myalgias.  Skin: Negative for rash.  Neurological: Positive for headaches. Negative for dizziness, syncope, light-headedness and numbness.  Hematological: Negative for adenopathy.  Psychiatric/Behavioral: Negative for agitation, confusion, sleep disturbance and suicidal ideas. The patient is not nervous/anxious.   BREAST: No symptoms   Objective: BP 100/80   Ht 5\' 5"  (1.651 m)   Wt 187 lb (84.8 kg)   LMP 08/08/2019 (Approximate)   BMI 31.12 kg/m    Physical Exam Constitutional:      Appearance: She is well-developed.  Genitourinary:     Vulva, vagina, uterus, right adnexa and left adnexa normal.     No vulval lesion or tenderness noted.     No vaginal discharge, erythema or tenderness.     No cervical motion tenderness or polyp.     IUD strings visualized.     Uterus is not enlarged or tender.     No right or left adnexal mass present.     Right adnexa not tender.     Left adnexa not tender.  Neck:     Musculoskeletal: Normal range of motion.     Thyroid: No thyromegaly.  Cardiovascular:     Rate and Rhythm: Normal rate and regular rhythm.     Heart sounds: Normal heart sounds. No murmur.  Pulmonary:     Effort: Pulmonary effort is normal.     Breath  sounds: Normal breath sounds.  Chest:     Breasts:        Right: No mass, nipple discharge, skin change or tenderness.        Left: No mass, nipple discharge, skin change or tenderness.  Abdominal:     Palpations: Abdomen is soft.     Tenderness: There is no abdominal tenderness. There is no guarding.  Musculoskeletal: Normal range of motion.  Neurological:     General: No focal deficit present.     Mental Status: She is alert and oriented to person, place, and time.     Cranial  Nerves: No cranial nerve deficit.  Skin:    General: Skin is warm and dry.  Psychiatric:        Mood and Affect: Mood normal.        Behavior: Behavior normal.        Thought Content: Thought content normal.        Judgment: Judgment normal.  Vitals signs reviewed.     Results: Results for orders placed or performed in visit on 08/12/19 (from the past 24 hour(s))  POCT Wet Prep with KOH     Status: Abnormal   Collection Time: 08/12/19  9:34 AM  Result Value Ref Range   Trichomonas, UA Negative    Clue Cells Wet Prep HPF POC pos    Epithelial Wet Prep HPF POC     Yeast Wet Prep HPF POC neg    Bacteria Wet Prep HPF POC     RBC Wet Prep HPF POC     WBC Wet Prep HPF POC     KOH Prep POC Positive (A) Negative    Assessment/Plan: Encounter for annual routine gynecological examination  Screening for STD (sexually transmitted disease) - Plan: [facility] STD  Encounter for routine checking of intrauterine contraceptive device (IUD); IUD strings in place. Due for removal 2023.  Bacterial vaginosis - Plan: clindamycin (CLEOCIN) 300 MG capsule, POCT Wet Prep with KOH; Recurrent sx. Needs to treat with clindamycin and not flagyl due to type of bacteria with her BV (confirmed on culture in past). Cont probiotics, add boric acid supp QHS for 2-3 months, CONDOMS. F/u prn.    Meds ordered this encounter  Medications  . clindamycin (CLEOCIN) 300 MG capsule    Sig: Take 1 capsule (300 mg total) by mouth 2 (two) times  daily for 7 days.    Dispense:  14 capsule    Refill:  0    Order Specific Question:   Supervising Provider    Answer:   Nadara Mustard [[id number]]             GYN counsel adequate intake of calcium and vitamin D, diet and exercise     F/U  Return in about 1 year (around 08/11/2020).   B. , PA-C 08/12/2019 9:34 AM

## 2019-08-12 ENCOUNTER — Encounter: Payer: Self-pay | Admitting: Obstetrics and Gynecology

## 2019-08-12 ENCOUNTER — Other Ambulatory Visit: Payer: Self-pay

## 2019-08-12 ENCOUNTER — Ambulatory Visit (INDEPENDENT_AMBULATORY_CARE_PROVIDER_SITE_OTHER): Payer: BC Managed Care – PPO | Admitting: Obstetrics and Gynecology

## 2019-08-12 ENCOUNTER — Other Ambulatory Visit (HOSPITAL_COMMUNITY)
Admission: RE | Admit: 2019-08-12 | Discharge: 2019-08-12 | Disposition: A | Discharge status: Home / Self Care | Payer: BC Managed Care – PPO | Source: Ambulatory Visit | Attending: Obstetrics and Gynecology | Admitting: Obstetrics and Gynecology

## 2019-08-12 VITALS — BP 100/80 | Ht 65.0 in | Wt 187.0 lb

## 2019-08-12 DIAGNOSIS — N760 Acute vaginitis: Secondary | ICD-10-CM | POA: Diagnosis not present

## 2019-08-12 DIAGNOSIS — B9689 Other specified bacterial agents as the cause of diseases classified elsewhere: Secondary | ICD-10-CM | POA: Insufficient documentation

## 2019-08-12 DIAGNOSIS — Z113 Encounter for screening for infections with a predominantly sexual mode of transmission: Secondary | ICD-10-CM

## 2019-08-12 DIAGNOSIS — Z30431 Encounter for routine checking of intrauterine contraceptive device: Secondary | ICD-10-CM

## 2019-08-12 DIAGNOSIS — Z01419 Encounter for gynecological examination (general) (routine) without abnormal findings: Secondary | ICD-10-CM

## 2019-08-12 LAB — POCT WET PREP WITH KOH
Clue Cells Wet Prep HPF POC: POSITIVE
KOH Prep POC: POSITIVE — AB
Trichomonas, UA: NEGATIVE
Yeast Wet Prep HPF POC: NEGATIVE

## 2019-08-12 MED ORDER — CLINDAMYCIN HCL 300 MG PO CAPS: 300.0000 mg | ORAL_CAPSULE | Freq: Two times a day (BID) | ORAL | 0 refills | Status: AC

## 2019-08-12 NOTE — Patient Instructions (Signed)
I value your feedback and entrusting us with your care. If you get a  patient survey, I would appreciate you taking the time to let us know about your experience today. Thank you! 

## 2019-08-14 LAB — CERVICOVAGINAL ANCILLARY ONLY
Chlamydia: NEGATIVE
Comment: NEGATIVE
Comment: NEGATIVE
Comment: NORMAL
Neisseria Gonorrhea: NEGATIVE
Trichomonas: NEGATIVE

## 2019-08-18 DIAGNOSIS — F331 Major depressive disorder, recurrent, moderate: Secondary | ICD-10-CM | POA: Diagnosis not present

## 2019-08-31 ENCOUNTER — Telehealth: Payer: Self-pay

## 2019-08-31 NOTE — Telephone Encounter (Signed)
Pt was previously seen for BV and now is concerns she has gotten a yeast infection from the BV meds which pt states usually happens, She would like something called in for this please. Thank you

## 2019-09-01 ENCOUNTER — Other Ambulatory Visit: Payer: Self-pay | Admitting: Obstetrics & Gynecology

## 2019-09-01 DIAGNOSIS — F331 Major depressive disorder, recurrent, moderate: Secondary | ICD-10-CM | POA: Diagnosis not present

## 2019-09-01 MED ORDER — FLUCONAZOLE 150 MG PO TABS: 150.0000 mg | ORAL_TABLET | Freq: Once | ORAL | 0 refills | Status: AC

## 2019-09-01 NOTE — Telephone Encounter (Signed)
ERx done.  Appt Thurs if no better

## 2019-09-01 NOTE — Telephone Encounter (Signed)
Pt aware.

## 2019-09-01 NOTE — Telephone Encounter (Signed)
Pt is returning call from yesterday since she has not heard back, [facility] not in office. Do you think you could help and send in RX for yeast infection? She was previously on antibiotic. Thank you.

## 2019-09-15 DIAGNOSIS — F331 Major depressive disorder, recurrent, moderate: Secondary | ICD-10-CM | POA: Diagnosis not present

## 2019-10-20 DIAGNOSIS — F331 Major depressive disorder, recurrent, moderate: Secondary | ICD-10-CM | POA: Diagnosis not present

## 2019-11-10 DIAGNOSIS — F331 Major depressive disorder, recurrent, moderate: Secondary | ICD-10-CM | POA: Diagnosis not present

## 2019-12-28 DIAGNOSIS — N760 Acute vaginitis: Secondary | ICD-10-CM | POA: Diagnosis not present

## 2019-12-28 DIAGNOSIS — Z113 Encounter for screening for infections with a predominantly sexual mode of transmission: Secondary | ICD-10-CM | POA: Diagnosis not present

## 2020-04-12 DIAGNOSIS — Z20822 Contact with and (suspected) exposure to covid-19: Secondary | ICD-10-CM | POA: Diagnosis not present

## 2020-04-12 DIAGNOSIS — J069 Acute upper respiratory infection, unspecified: Secondary | ICD-10-CM | POA: Diagnosis not present

## 2020-07-10 DIAGNOSIS — Z20822 Contact with and (suspected) exposure to covid-19: Secondary | ICD-10-CM | POA: Diagnosis not present

## 2020-08-14 NOTE — Progress Notes (Signed)
PCP:  Farris Has, MD   Chief Complaint  Patient presents with  . Gynecologic Exam    Std testing      HPI:      [name] is a 34 y.o. G3P0011 who LMP was Patient's last menstrual period was 08/11/2020 (approximate)., presents today for her annual examination.  Her menses are regular with IUD, lasting 4 days, light spotting with IUD.  Dysmenorrhea none. She does not have intermenstrual bleeding.   Sex activity: sexually active--contraception Mirena, placed 05/14/16. Wants STD testing. May want BTL. Last Pap: April 02, 2016  Results were: no abnormalities /neg HPV DNA  Hx of STDs: chlamydia, HPV, trich; neg STD testing 12/20   Hx of recurrent BV with atopobium, BVAB2, megasphaera on 8/17 One Swab. Treated with clindamycin crm in past but preferred oral flagyl. Using boric acid supp with sx control now.   There is no FH of breast cancer. There is no FH of ovarian cancer. The patient does self-breast exams.  Tobacco use: The patient denies current or previous tobacco use. Alcohol use: social drinker No drug use.  Exercise: moderately active  She does get adequate calcium but not Vitamin D in her diet.   Past Medical History:  Diagnosis Date  . Anemia   . Migraines    otc med prn  . SVD (spontaneous vaginal delivery)    x 1    Past Surgical History:  Procedure Laterality Date  . COLPOSCOPY  2004   neg paps since  . DILATION AND CURETTAGE OF UTERUS N/A 10/26/2014   Procedure: SUCTION DILATATION AND CURETTAGE  ;  Surgeon: Purcell Nails, MD;  Location: [facility];  Service: Gynecology;  Laterality: N/A;  . WISDOM TOOTH EXTRACTION      Family History  Problem Relation Age of Onset  . Cancer Maternal Grandmother        throat    Social History   Socioeconomic History  . Marital status: Single    Spouse name: Not on file  . Number of children: 1  . Years of education: masters  . Highest education level: Not on file  Occupational History  . Occupation:  [employer]  Tobacco Use  . Smoking status: Never Smoker  . Smokeless tobacco: Never Used  Vaping Use  . Vaping Use: Never used  Substance and Sexual Activity  . Alcohol use: Yes    Comment: socially  . Drug use: No  . Sexual activity: Not Currently    Birth control/protection: I.U.D.    Comment: mirena  Other Topics Concern  . Not on file  Social History Narrative   Patient lives at with her son and boyfriends, she drinks 4 soda's  A day.   Social Determinants of Health   Financial Resource Strain: Not on file  Food Insecurity: Not on file  Transportation Needs: Not on file  Physical Activity: Not on file  Stress: Not on file  Social Connections: Not on file  Intimate Partner Violence: Not on file    Outpatient Medications Prior to Visit  Medication Sig Dispense Refill  . levonorgestrel (MIRENA) 20 MCG/24HR IUD 1 each by Intrauterine route once.    . metroNIDAZOLE (METROGEL) 0.75 % vaginal gel SMARTSIG:1 Applicator Vaginal Once a Week     No facility-administered medications prior to visit.    ROS:  Review of Systems  Constitutional: Negative for fatigue, fever and unexpected weight change.  Respiratory: Negative for cough, shortness of breath and wheezing.  Cardiovascular: Negative for chest pain, palpitations and leg swelling.  Gastrointestinal: Negative for blood in stool, constipation, diarrhea, nausea and vomiting.  Endocrine: Negative for cold intolerance, heat intolerance and polyuria.  Genitourinary: Negative for dyspareunia, dysuria, flank pain, frequency, genital sores, hematuria, menstrual problem, pelvic pain, urgency, vaginal bleeding, vaginal discharge and vaginal pain.  Musculoskeletal: Negative for back pain, joint swelling and myalgias.  Skin: Negative for rash.  Neurological: Negative for dizziness, syncope, light-headedness, numbness and headaches.  Hematological: Negative for adenopathy.  Psychiatric/Behavioral: Negative for  agitation, confusion, sleep disturbance and suicidal ideas. The patient is not nervous/anxious.   BREAST: No symptoms   Objective: BP 110/60   Ht 5\' 5"  (1.651 m)   Wt 186 lb (84.4 kg)   LMP 08/11/2020 (Approximate)   BMI 30.95 kg/m    Physical Exam Constitutional:      Appearance: She is well-developed.  Genitourinary:     Vulva, vagina, uterus, right adnexa and left adnexa normal.     No labial lesion or tenderness noted.     No vaginal discharge, erythema or tenderness.      Right Adnexa: not tender and no mass present.    Left Adnexa: not tender and no mass present.    No cervical motion tenderness or polyp.     IUD strings visualized.     Uterus is not enlarged or tender.  Breasts:     Right: No mass, nipple discharge, skin change or tenderness.     Left: No mass, nipple discharge, skin change or tenderness.    Neck:     Thyroid: No thyromegaly.  Cardiovascular:     Rate and Rhythm: Normal rate and regular rhythm.     Heart sounds: Normal heart sounds. No murmur heard.   Pulmonary:     Effort: Pulmonary effort is normal.     Breath sounds: Normal breath sounds.  Abdominal:     Palpations: Abdomen is soft.     Tenderness: There is no abdominal tenderness. There is no guarding.  Musculoskeletal:        General: Normal range of motion.     Cervical back: Normal range of motion.  Neurological:     General: No focal deficit present.     Mental Status: She is alert and oriented to person, place, and time.     Cranial Nerves: No cranial nerve deficit.  Skin:    General: Skin is warm and dry.  Psychiatric:        Mood and Affect: Mood normal.        Behavior: Behavior normal.        Thought Content: Thought content normal.        Judgment: Judgment normal.  Vitals reviewed.     Assessment/Plan: Encounter for annual routine gynecological examination  Cervical cancer screening - Plan: Cytology - PAP  Screening for STD (sexually transmitted disease) - Plan:  Cytology - PAP  Screening for HPV (human papillomavirus) - Plan: Cytology - PAP  Encounter for routine checking of intrauterine contraceptive device (IUD)--IUD strings in cx os. Has 7 yr indication now. Pt may want BTL and will f/u with MD for consultation prn.     GYN counsel adequate intake of calcium and vitamin D, diet and exercise     F/U  Return in about 1 year (around 08/15/2021).  Khai Torbert B. Jewelz Kobus, PA-C 08/15/2020 9:09 AM

## 2020-08-15 ENCOUNTER — Encounter: Payer: Self-pay | Admitting: Obstetrics and Gynecology

## 2020-08-15 ENCOUNTER — Ambulatory Visit (INDEPENDENT_AMBULATORY_CARE_PROVIDER_SITE_OTHER): Payer: BC Managed Care – PPO | Admitting: Obstetrics and Gynecology

## 2020-08-15 ENCOUNTER — Other Ambulatory Visit: Payer: Self-pay

## 2020-08-15 ENCOUNTER — Other Ambulatory Visit (HOSPITAL_COMMUNITY)
Admission: RE | Admit: 2020-08-15 | Discharge: 2020-08-15 | Disposition: A | Discharge status: Home / Self Care | Payer: BC Managed Care – PPO | Source: Ambulatory Visit | Attending: Obstetrics and Gynecology | Admitting: Obstetrics and Gynecology

## 2020-08-15 VITALS — BP 110/60 | Ht 65.0 in | Wt 186.0 lb

## 2020-08-15 DIAGNOSIS — Z1151 Encounter for screening for human papillomavirus (HPV): Secondary | ICD-10-CM | POA: Diagnosis not present

## 2020-08-15 DIAGNOSIS — Z113 Encounter for screening for infections with a predominantly sexual mode of transmission: Secondary | ICD-10-CM | POA: Diagnosis not present

## 2020-08-15 DIAGNOSIS — Z01419 Encounter for gynecological examination (general) (routine) without abnormal findings: Secondary | ICD-10-CM

## 2020-08-15 DIAGNOSIS — Z124 Encounter for screening for malignant neoplasm of cervix: Secondary | ICD-10-CM

## 2020-08-15 DIAGNOSIS — Z30431 Encounter for routine checking of intrauterine contraceptive device: Secondary | ICD-10-CM

## 2020-08-15 NOTE — Patient Instructions (Signed)
I value your feedback and entrusting us with your care. If you get a [facility] patient survey, I would appreciate you taking the time to let us know about your experience today. Thank you!  As of August 13, 2019, your lab results will be released to your MyChart immediately, before I even have a chance to see them. Please give me time to review them and contact you if there are any abnormalities. Thank you for your patience.  

## 2020-08-19 LAB — CYTOLOGY - PAP
Chlamydia: NEGATIVE
Comment: NEGATIVE
Comment: NEGATIVE
Comment: NORMAL
Diagnosis: NEGATIVE
Neisseria Gonorrhea: NEGATIVE
Trichomonas: NEGATIVE

## 2020-09-20 ENCOUNTER — Encounter: Payer: Self-pay | Admitting: Obstetrics and Gynecology

## 2020-10-05 NOTE — Telephone Encounter (Signed)
Will send to billing.

## 2020-11-07 DIAGNOSIS — Z01419 Encounter for gynecological examination (general) (routine) without abnormal findings: Secondary | ICD-10-CM | POA: Diagnosis not present

## 2020-11-07 DIAGNOSIS — Z113 Encounter for screening for infections with a predominantly sexual mode of transmission: Secondary | ICD-10-CM | POA: Diagnosis not present

## 2020-11-30 NOTE — Telephone Encounter (Signed)
[facility] billing °

## 2020-12-01 NOTE — Telephone Encounter (Signed)
LM with Gwyneth Sprout 11/30/20 re: pt bill.

## 2020-12-02 NOTE — Telephone Encounter (Signed)
Autumn Evans has sent to a different billing person due to the type of charges.

## 2021-02-13 DIAGNOSIS — Z113 Encounter for screening for infections with a predominantly sexual mode of transmission: Secondary | ICD-10-CM | POA: Diagnosis not present

## 2021-08-15 NOTE — Progress Notes (Addendum)
PCP:  Farris Has, MD   Chief Complaint  Patient presents with   Gynecologic Exam    Possibly remove IUD and would like tubal ligation    HPI:      [name] is a 35 y.o. G3P0011 who LMP was No LMP recorded. (Menstrual status: IUD)., presents today for her annual examination.  Her menses are monthly with IUD, lasting 3-4 days, light brown spotting only.  Dysmenorrhea none. She does not have intermenstrual bleeding.   Sex activity: sexually active--contraception Mirena, placed 05/14/16. Wants STD testing. Would like BTL. Last Pap: 08/15/20  Results were: no abnormalities /neg HPV DNA  Hx of STDs: chlamydia, HPV, trich; neg STD testing 12/21   Hx of recurrent BV with atopobium, BVAB2, megasphaera on 8/17 One Swab. Treated with clindamycin crm in past but preferred oral flagyl. Using boric acid supp with sx control now. No recent sx.   There is no FH of breast cancer. There is no FH of ovarian cancer. The patient does self-breast exams.  Tobacco use: The patient denies current or previous tobacco use. Alcohol use: none No drug use.  Exercise: moderately active  She does get adequate calcium but not Vitamin D in her diet.   Past Medical History:  Diagnosis Date   Anemia    Migraines    otc med prn   SVD (spontaneous vaginal delivery)    x 1    Past Surgical History:  Procedure Laterality Date   COLPOSCOPY  2004   neg paps since   DILATION AND CURETTAGE OF UTERUS N/A 10/26/2014   Procedure: SUCTION DILATATION AND CURETTAGE  ;  Surgeon: Purcell Nails, MD;  Location: [facility];  Service: Gynecology;  Laterality: N/A;   WISDOM TOOTH EXTRACTION      Family History  Problem Relation Age of Onset   Cancer Maternal Grandmother        throat    Social History   Socioeconomic History   Marital status: Single    Spouse name: Not on file   Number of children: 1   Years of education: masters   Highest education level: Not on file  Occupational History    Occupation: [employer]  Tobacco Use   Smoking status: Never   Smokeless tobacco: Never  Vaping Use   Vaping Use: Never used  Substance and Sexual Activity   Alcohol use: Yes    Comment: socially   Drug use: No   Sexual activity: Yes    Birth control/protection: I.U.D.    Comment: mirena  Other Topics Concern   Not on file  Social History Narrative   Patient lives at with her son and boyfriends, she drinks 4 soda's  A day.   Social Determinants of Health   Financial Resource Strain: Not on file  Food Insecurity: Not on file  Transportation Needs: Not on file  Physical Activity: Not on file  Stress: Not on file  Social Connections: Not on file  Intimate Partner Violence: Not on file    Outpatient Medications Prior to Visit  Medication Sig Dispense Refill   levonorgestrel (MIRENA) 20 MCG/24HR IUD 1 each by Intrauterine route once.     No facility-administered medications prior to visit.    ROS:  Review of Systems  Constitutional:  Negative for fatigue, fever and unexpected weight change.  Respiratory:  Negative for cough, shortness of breath and wheezing.   Cardiovascular:  Negative for chest pain, palpitations and leg swelling.  Gastrointestinal:  Negative for blood in stool, constipation, diarrhea, nausea and vomiting.  Endocrine: Negative for cold intolerance, heat intolerance and polyuria.  Genitourinary:  Negative for dyspareunia, dysuria, flank pain, frequency, genital sores, hematuria, menstrual problem, pelvic pain, urgency, vaginal bleeding, vaginal discharge and vaginal pain.  Musculoskeletal:  Negative for back pain, joint swelling and myalgias.  Skin:  Negative for rash.  Neurological:  Negative for dizziness, syncope, light-headedness, numbness and headaches.  Hematological:  Negative for adenopathy.  Psychiatric/Behavioral:  Negative for agitation, confusion, sleep disturbance and suicidal ideas. The patient is not nervous/anxious.   BREAST: No symptoms   Objective: BP 90/70    Ht 5\' 5"  (1.651 m)    Wt 193 lb (87.5 kg)    BMI 32.12 kg/m    Physical Exam Constitutional:      Appearance: She is well-developed.  Genitourinary:     Vulva normal.     Right Labia: No rash, tenderness or lesions.    Left Labia: No tenderness, lesions or rash.    No vaginal discharge, erythema or tenderness.      Right Adnexa: not tender and no mass present.    Left Adnexa: not tender and no mass present.    No cervical motion tenderness, friability or polyp.     IUD strings visualized.     Uterus is not enlarged or tender.  Breasts:    Right: No mass, nipple discharge, skin change or tenderness.     Left: No mass, nipple discharge, skin change or tenderness.  Neck:     Thyroid: No thyromegaly.  Cardiovascular:     Rate and Rhythm: Normal rate and regular rhythm.     Heart sounds: Normal heart sounds. No murmur heard. Pulmonary:     Effort: Pulmonary effort is normal.     Breath sounds: Normal breath sounds.  Abdominal:     Palpations: Abdomen is soft.     Tenderness: There is no abdominal tenderness. There is no guarding or rebound.  Musculoskeletal:        General: Normal range of motion.     Cervical back: Normal range of motion.  Lymphadenopathy:     Cervical: No cervical adenopathy.  Neurological:     General: No focal deficit present.     Mental Status: She is alert and oriented to person, place, and time.     Cranial Nerves: No cranial nerve deficit.  Skin:    General: Skin is warm and dry.  Psychiatric:        Mood and Affect: Mood normal.        Behavior: Behavior normal.        Thought Content: Thought content normal.        Judgment: Judgment normal.  Vitals reviewed.    Assessment/Plan: Encounter for annual routine gynecological examination  Screening for STD (sexually transmitted disease) - Plan: Cervicovaginal ancillary only  Encounter for routine checking of intrauterine contraceptive device  (IUD); IUD has 8 yr indication. Pt would like TL. RTO with MD for consult, will remove IUD with procedure. F/u prn.     GYN counsel adequate intake of calcium and vitamin D, diet and exercise     F/U  Return in about 1 year (around 08/16/2022).  Esti Demello B. Niamh Rada, PA-C 08/16/2021 10:05 AM

## 2021-08-16 ENCOUNTER — Other Ambulatory Visit (HOSPITAL_COMMUNITY)
Admission: RE | Admit: 2021-08-16 | Discharge: 2021-08-16 | Disposition: A | Discharge status: Home / Self Care | Payer: BC Managed Care – PPO | Source: Ambulatory Visit | Attending: Obstetrics and Gynecology | Admitting: Obstetrics and Gynecology

## 2021-08-16 ENCOUNTER — Other Ambulatory Visit: Payer: Self-pay

## 2021-08-16 ENCOUNTER — Ambulatory Visit (INDEPENDENT_AMBULATORY_CARE_PROVIDER_SITE_OTHER): Payer: BC Managed Care – PPO | Admitting: Obstetrics and Gynecology

## 2021-08-16 ENCOUNTER — Encounter: Payer: Self-pay | Admitting: Obstetrics and Gynecology

## 2021-08-16 VITALS — BP 90/70 | Ht 65.0 in | Wt 193.0 lb

## 2021-08-16 DIAGNOSIS — Z01419 Encounter for gynecological examination (general) (routine) without abnormal findings: Secondary | ICD-10-CM | POA: Diagnosis not present

## 2021-08-16 DIAGNOSIS — Z30431 Encounter for routine checking of intrauterine contraceptive device: Secondary | ICD-10-CM | POA: Diagnosis not present

## 2021-08-16 DIAGNOSIS — Z113 Encounter for screening for infections with a predominantly sexual mode of transmission: Secondary | ICD-10-CM

## 2021-08-16 NOTE — Addendum Note (Signed)
Addended by: Althea Grimmer B on: 08/16/2021 10:10 AM   Modules accepted: Orders

## 2021-08-16 NOTE — Patient Instructions (Signed)
I value your feedback and you entrusting us with your care. If you get a [facility] patient survey, I would appreciate you taking the time to let us know about your experience today. Thank you! ? ? ?

## 2021-08-17 LAB — CERVICOVAGINAL ANCILLARY ONLY
Chlamydia: NEGATIVE
Comment: NEGATIVE
Comment: NEGATIVE
Comment: NORMAL
Neisseria Gonorrhea: NEGATIVE
Trichomonas: NEGATIVE

## 2021-09-18 ENCOUNTER — Other Ambulatory Visit: Payer: Self-pay

## 2021-09-18 ENCOUNTER — Encounter: Payer: Self-pay | Admitting: Obstetrics & Gynecology

## 2021-09-18 ENCOUNTER — Ambulatory Visit: Payer: BC Managed Care – PPO | Admitting: Obstetrics & Gynecology

## 2021-09-18 VITALS — BP 120/80 | Ht 65.0 in | Wt 194.0 lb

## 2021-09-18 DIAGNOSIS — Z3009 Encounter for other general counseling and advice on contraception: Secondary | ICD-10-CM | POA: Diagnosis not present

## 2021-09-18 NOTE — Progress Notes (Signed)
°  Contraception Counseling Patient presents for contraception counseling. The patient has no complaints today. The patient is sexually active. Pertinent past medical history: none.  She has IUD, and rare periods.  She now desires permanent sterilization procedure.    PMHx: She  has a past medical history of Anemia, Migraines, and SVD (spontaneous vaginal delivery). Also,  has a past surgical history that includes Wisdom tooth extraction; Dilation and curettage of uterus (N/A, 10/26/2014); and Colposcopy (2004)., family history includes Cancer in her maternal grandmother.,  reports that she has never smoked. She has never used smokeless tobacco. She reports current alcohol use. She reports that she does not use drugs.  She has a current medication list which includes the following prescription(s): levonorgestrel. Also, has No Known Allergies.  Review of Systems  Constitutional:  Negative for chills, fever and malaise/fatigue.  HENT:  Negative for congestion, sinus pain and sore throat.   Eyes:  Negative for blurred vision and pain.  Respiratory:  Negative for cough and wheezing.   Cardiovascular:  Negative for chest pain and leg swelling.  Gastrointestinal:  Negative for abdominal pain, constipation, diarrhea, heartburn, nausea and vomiting.  Genitourinary:  Negative for dysuria, frequency, hematuria and urgency.  Musculoskeletal:  Negative for back pain, joint pain, myalgias and neck pain.  Skin:  Negative for itching and rash.  Neurological:  Negative for dizziness, tremors and weakness.  Endo/Heme/Allergies:  Does not bruise/bleed easily.  Psychiatric/Behavioral:  Negative for depression. The patient is not nervous/anxious and does not have insomnia.    Objective: BP 120/80    Ht 5\' 5"  (1.651 m)    Wt 194 lb (88 kg)    BMI 32.28 kg/m  Physical Exam Constitutional:      General: She is not in acute distress.    Appearance: She is well-developed.  Musculoskeletal:        General: Normal  range of motion.  Neurological:     Mental Status: She is alert and oriented to person, place, and time.  Skin:    General: Skin is warm and dry.  Vitals reviewed.    ASSESSMENT/PLAN:   Problem List Items Addressed This Visit     Sterilization consult    -  Primary  The patient has been fully informed about all methods of contraception, both temporary and permanent. She understands that tubal ligation is meant to be permanent, absolute and irreversible. She was told that there is an approximately 1 in 400 chance of a pregnancy in the future after tubal ligation. She was told the short and long term complications of tubal ligation. She understands the risks from this surgery include, but are not limited to, the risks of anesthesia, hemorrhage, infection, perforation, and injury to adjacent structures, bowel, bladder and blood vessels.  Will also plan to remove IUD at that time Counseled that period may change, may require future therapy if severe Plan surgery after Feb 13 based on already planned vacation     Barnett Applebaum, MD, [facility], [facility] 09/18/2021  3:13 PM

## 2021-09-18 NOTE — Patient Instructions (Signed)
Surgery to Prevent Pregnancy °Sterilization is surgery to prevent pregnancy. Sterilization is permanent. It should only be done if you are sure that you do not want to have children. °For females, the fallopian tubes are either blocked or closed off. When the fallopian tubes are closed, the eggs that the ovaries release cannot enter the uterus, sperm cannot reach the eggs, and pregnancy is prevented. °For males, the vas deferens is cut and then tied or burned (cauterized). The vas deferens is a tube that carries sperm from the testicles. This procedure prevents pregnancy by blocking sperm from going through the vas deferens and penis during ejaculation. °Types of sterilization °  °For females, the surgeries include: °Laparoscopic tubal ligation. In this surgery, the fallopian tubes are tied off, sealed with heat, or blocked with a clip, ring, or clamp. A small portion of each fallopian tube may also be removed. This surgery is done through several small cuts (incisions) with special instruments that are inserted into the abdomen. °Postpartum tubal ligation. This is also called a mini-laparotomy. This surgery is done right after childbirth or 1 or 2 days after childbirth. In this surgery, the fallopian tubes are tied off, sealed with heat, or blocked with a clip, ring, or clamp. A small portion of each fallopian tube may also be removed. The surgery is done through a single incision in the abdomen. °Tubal ligation during a C-section. In this surgery, the fallopian tubes are tied off, sealed with heat, or blocked with a clip, ring, or clamp. A small portion of each fallopian tube may also be removed. The surgery is done at the same time as a C-section delivery. °For males, the surgeries include: °Incision vasectomy. In this surgery, one or two small incisions are made in the scrotum. The vas deferens will be pulled out of the scrotum and cut. The vas deferens will be tied off or sealed with heat and placed back into  your scrotum. The incision will be closed with absorbable stitches (sutures). °No scalpel vasectomy. In this surgery, a punctured opening is made in the scrotum. The vas deferens will be pulled out of the scrotum and cut. The vas deferens will be tied off or sealed with heat and placed back into your scrotum. The opening is small and will not require sutures. °What are the benefits of sterilization? °It is usually effective for a lifetime. °The procedures are generally safe. °For females, sterilization does not affect the hormones, like other types of birth control. Because of this, menstrual periods will not be affected. °For both males and females, sexual desire and sexual performance will not be affected. °What are the disadvantages of sterilization? °Risks from the surgery °Generally, sterilization is safe. Complications are rare. However, there are some risks. They include: °Bleeding. °Infection. °Reaction to medicine used during the procedure. °Injury to surrounding organs. °Risks after sterilization °After a successful surgery, you may have other problems. Female sterilization risks may include: °Failure of the procedure. Sterilization is nearly 100% effective, but it can fail. In rare cases, the fallopian tubes can grow back together over time. If this happens, a female will be able to get pregnant again. °A higher risk of having an ectopic pregnancy. An ectopic pregnancy is a pregnancy that grows outside of the uterus. This kind of pregnancy can lead to serious bleeding if it is not treated. °Female sterilization risks may include: °Bleeding and swelling of the scrotum. °Failure of the procedure. There is a very small chance that the tied or cauterized   ends of the vas deferens may reconnect (recanalization). If this happens, a female could still make a female pregnant. °Other risks may include: °A risk that you may change your mind and decide you want have children. Sterilization may be reversed, but a reversal  is not always successful. °Lack of protection against sexually transmitted infections (STIs). °What happens during the procedure? °The steps of the procedure depend on the type of sterilization you are having. The procedure may vary among health care providers and hospitals. °Questions to ask your health care provider °How effective are sterilization procedures? °What type of procedure is right for me? °Is it possible to reverse the procedure if I change my mind? °What can I expect after the procedure? °Where to find more information °American College of Obstetricians and Gynecologists: www.acog.org/ °[place] Department of Health and Human Services: www.womenshealth.gov/ °[facility] [facility]: www.urologyhealth.org °Summary °Sterilization is surgery to prevent pregnancy. °There are different types of sterilization surgeries. °Sterilization may be reversed, but a reversal is not always successful. °Sterilization does not protect against STIs. °This information is not intended to replace advice given to you by your health care provider. Make sure you discuss any questions you have with your health care provider. °Document Revised: 05/21/2020 Document Reviewed: 05/21/2020 °Elsevier Patient Education © 2022 Elsevier Inc. ° °

## 2021-09-25 ENCOUNTER — Telehealth: Payer: Self-pay

## 2021-09-25 ENCOUNTER — Other Ambulatory Visit: Payer: Self-pay | Admitting: Obstetrics & Gynecology

## 2021-09-25 DIAGNOSIS — Z3009 Encounter for other general counseling and advice on contraception: Secondary | ICD-10-CM

## 2021-09-25 NOTE — Telephone Encounter (Signed)
-----   Message from [facility] sent at 09/19/2021  4:56 PM EST ----- Regarding: FW: Surgery  ----- Message ----- From: Gae Dry, MD Sent: 09/18/2021   3:13 PM EST To: [facility] Subject: Surgery                                        Surgery Booking Request Patient Full Name:  Autumn Evans  MRN: [id number]  DOB: 1985/09/18  Surgeon: Hoyt Koch, MD  Requested Surgery Date and Time: After [phone] Primary Diagnosis AND Code: Starilization Secondary Diagnosis and Code:  Surgical Procedure: Laparoscopy with Tubal Partial Salingectomy, Removal of IUD RNFA Requested?: No L&D Notification: No Admission Status: same day surgery Length of Surgery: 50 min Special Case Needs: No H&P: Yes Phone Interview???:  Yes Interpreter: No Medical Clearance:  No Special Scheduling Instructions: No Any known health/anesthesia issues, diabetes, sleep apnea, latex allergy, defibrillator/pacemaker?: No Acuity: P3   (P1 highest, P2 delay may cause harm, P3 low, elective gyn, P4 lowest) Post op follow up visits: yes

## 2021-09-25 NOTE — Telephone Encounter (Signed)
Patient called to schedule Laparoscopy with Tubal Partial Salingectomy, Removal of IUD with Tiburcio Pea  DOS 2/23  H&P 2/8 @ 2:55   Pre-admit phone call appointment to be requested - date and time will be included on H&P paper work. Also all appointments will be updated on pt MyChart. Explained that this appointment has a call window. Based on the time scheduled will indicate if the call will be received within a 4 hour window before 1:00 or after.  Advised that pt may also receive calls from the hospital pharmacy and pre-service center.  Confirmed pt has [employer] as Editor, commissioning. No secondary insurance.

## 2021-10-11 ENCOUNTER — Encounter: Payer: Self-pay | Admitting: Obstetrics & Gynecology

## 2021-10-11 ENCOUNTER — Other Ambulatory Visit: Payer: Self-pay

## 2021-10-11 ENCOUNTER — Ambulatory Visit (INDEPENDENT_AMBULATORY_CARE_PROVIDER_SITE_OTHER): Payer: BC Managed Care – PPO | Admitting: Obstetrics & Gynecology

## 2021-10-11 VITALS — BP 112/74 | Ht 65.0 in | Wt 196.0 lb

## 2021-10-11 DIAGNOSIS — Z3009 Encounter for other general counseling and advice on contraception: Secondary | ICD-10-CM

## 2021-10-11 NOTE — Patient Instructions (Signed)
Laparoscopic Tubal Ligation, Care After °The following information offers guidance on how to care for yourself after your procedure. Your health care provider may also give you more specific instructions. If you have problems or questions, contact your health care provider. °What can I expect after the procedure? °After the procedure, it is common to have: °A sore throat if general anesthesia was used. °Pain in shoulders, back, and abdomen. This is caused by the gas that was used during the procedure. °Mild discomfort or cramping in your abdomen. °Pain or soreness in the area where the surgical incision was made. °A bloated feeling. °Tiredness. °Nausea and vomiting. °Follow these instructions at home: °Medicines °Take over-the-counter and prescription medicines only as told by your health care provider. °Ask your health care provider if the medicine prescribed to you: °Requires you to avoid driving or using heavy machinery. °Can cause constipation. You may need to take these actions to prevent or treat constipation: °Drink enough fluid to keep your urine pale yellow. °Take over-the-counter or prescription medicines. °Eat foods that are high in fiber, such as beans, whole grains, and fresh fruits and vegetables. °Limit foods that are high in fat and processed sugars, such as fried or sweet foods. °Do not take aspirin because it can cause bleeding. °Incision care °  °Follow instructions from your health care provider about how to take care of your incision. Make sure you: °Wash your hands with soap and water for at least 20 seconds before and after you change your bandage (dressing). If soap and water are not available, use hand sanitizer. °Change your dressing as told by your health care provider. °Leave stitches (sutures), skin glue, or adhesive strips in place. These skin closures may need to stay in place for 2 weeks or longer. If adhesive strip edges start to loosen and curl up, you may trim the loose edges. Do  not remove adhesive strips completely unless your health care provider tells you to do that. °Check your incision area every day for signs of infection. Check for: °Redness, swelling, or more pain. °Fluid or blood. °Warmth. °Pus or a bad smell. °Activity °Rest as told by your health care provider. °Avoid sitting for a long time without moving. Get up to take short walks every 1-2 hours. This is important to improve blood flow and breathing. Ask for help if you feel weak or unsteady. °Do not have sex, douche, or put a tampon or anything else in your vagina for 6 weeks or as long as told by your health care provider. °Do not lift anything that is heavier than your baby for 2 weeks, or the limit that you are told, until your health care provider says that it is safe. °Do not take baths, swim, or use a hot tub until your health care provider approves. Ask your health care provider if you may take showers. You may only be allowed to take sponge baths. °Return to your normal activities as told by your health care provider. Ask your health care provider what activities are safe for you. °General instructions °After the procedure you may need to wear a sanitary pad for vaginal discharge. °Have someone help you with your daily household tasks for the first few days. °Keep all follow-up visits. This is important. °Contact a health care provider if: °You have redness, swelling, or more pain around your incision. °Your incision feels warm to the touch. °You have pus or a bad smell coming from your incision. °The edges of your incision break   open after the sutures have been removed. °Your pain does not improve after 2-3 days. °You have a rash. °You repeatedly become dizzy or light-headed. °Your pain medicine is not helping. °Get help right away if: °You have a fever or chills. °You faint. °You have increasing pain in your abdomen. °You have severe pain in one or both of your shoulders. °You have fluid or blood coming from your  sutures or heavy bleeding from your vagina. °You have shortness of breath or difficulty breathing. °You have chest pain, leg pain, or leg swelling. °You have ongoing nausea, vomiting, or diarrhea. °These symptoms may represent a serious problem that is an emergency. Do not wait to see if the symptoms will go away. Get medical help right away. Call your local emergency services (911 in the [place] Do not drive yourself to the hospital. °Summary °After the procedure, it is common to have mild discomfort or cramping in your abdomen. °After the procedure you may need to wear a sanitary pad for vaginal discharge. °Take over-the-counter and prescription medicines only as told by your health care provider. °Watch for symptoms that should prompt you to call your health care provider. °Keep all follow-up visits. This is important. °This information is not intended to replace advice given to you by your health care provider. Make sure you discuss any questions you have with your health care provider. °Document Revised: 05/06/2020 Document Reviewed: 05/06/2020 °Elsevier Patient Education © 2022 Elsevier Inc. ° °

## 2021-10-11 NOTE — Progress Notes (Signed)
PRE-OPERATIVE HISTORY AND PHYSICAL EXAM  HPI:  Autumn Evans is a 36 y.o. G3P0011 No LMP recorded. (Menstrual status: IUD).; she is being admitted for surgery related to requested sterilization.  Has IUD, prefers permanent sterilization.  PMHx: Past Medical History:  Diagnosis Date   Anemia    Migraines    otc med prn   SVD (spontaneous vaginal delivery)    x 1   Past Surgical History:  Procedure Laterality Date   COLPOSCOPY  2004   neg paps since   DILATION AND CURETTAGE OF UTERUS N/A 10/26/2014   Procedure: SUCTION DILATATION AND CURETTAGE  ;  Surgeon: Delice Lesch, MD;  Location: [facility];  Service: Gynecology;  Laterality: N/A;   WISDOM TOOTH EXTRACTION     Family History  Problem Relation Age of Onset   Cancer Maternal Grandmother        throat   Social History   Tobacco Use   Smoking status: Never   Smokeless tobacco: Never  Vaping Use   Vaping Use: Never used  Substance Use Topics   Alcohol use: Yes    Comment: socially   Drug use: No    Current Outpatient Medications:    levonorgestrel (MIRENA) 20 MCG/24HR IUD, 1 each by Intrauterine route once., Disp: , Rfl:  Allergies: Patient has no known allergies.  Review of Systems  Constitutional:  Negative for chills, fever and malaise/fatigue.  HENT:  Negative for congestion, sinus pain and sore throat.   Eyes:  Negative for blurred vision and pain.  Respiratory:  Negative for cough and wheezing.   Cardiovascular:  Negative for chest pain and leg swelling.  Gastrointestinal:  Negative for abdominal pain, constipation, diarrhea, heartburn, nausea and vomiting.  Genitourinary:  Negative for dysuria, frequency, hematuria and urgency.  Musculoskeletal:  Negative for back pain, joint pain, myalgias and neck pain.  Skin:  Negative for itching and rash.  Neurological:  Negative for dizziness, tremors and weakness.  Endo/Heme/Allergies:  Does not bruise/bleed easily.  Psychiatric/Behavioral:  Negative for  depression. The patient is not nervous/anxious and does not have insomnia.    Objective: BP 112/74    Ht 5\' 5"  (1.651 m)    Wt 196 lb (88.9 kg)    BMI 32.62 kg/m   Filed Weights   10/11/21 1454  Weight: 196 lb (88.9 kg)   Physical Exam Constitutional:      General: She is not in acute distress.    Appearance: She is well-developed.  HENT:     Head: Normocephalic and atraumatic. No laceration.     Right Ear: Hearing normal.     Left Ear: Hearing normal.     Mouth/Throat:     Pharynx: Uvula midline.  Eyes:     Pupils: Pupils are equal, round, and reactive to light.  Neck:     Thyroid: No thyromegaly.  Cardiovascular:     Rate and Rhythm: Normal rate and regular rhythm.     Heart sounds: No murmur heard.   No friction rub. No gallop.  Pulmonary:     Effort: Pulmonary effort is normal. No respiratory distress.     Breath sounds: Normal breath sounds. No wheezing.  Abdominal:     General: Bowel sounds are normal. There is no distension.     Palpations: Abdomen is soft.     Tenderness: There is no abdominal tenderness. There is no rebound.  Musculoskeletal:        General: Normal range of motion.  Cervical back: Normal range of motion and neck supple.  Neurological:     Mental Status: She is alert and oriented to person, place, and time.     Cranial Nerves: No cranial nerve deficit.  Skin:    General: Skin is warm and dry.  Psychiatric:        Judgment: Judgment normal.  Vitals reviewed.    Assessment: 1. Sterilization consult   Plans laparoscopy w bilateral partial salpingectomy, and also then removal of IUD  The patient has been fully informed about all methods of contraception, both temporary and permanent. She understands that tubal ligation is meant to be permanent, absolute and irreversible. She was told that there is an approximately 1 in 400 chance of a pregnancy in the future after tubal ligation. She was told the short and long term complications of tubal  ligation. She understands the risks from this surgery include, but are not limited to, the risks of anesthesia, hemorrhage, infection, perforation, and injury to adjacent structures, bowel, bladder and blood vessels.   Barnett Applebaum, MD, [facility], [facility] 10/11/2021  2:59 PM

## 2021-10-11 NOTE — H&P (View-Only) (Signed)
PRE-OPERATIVE HISTORY AND PHYSICAL EXAM  HPI:  Autumn Evans is a 36 y.o. G3P0011 No LMP recorded. (Menstrual status: IUD).; she is being admitted for surgery related to requested sterilization.  Has IUD, prefers permanent sterilization.  PMHx: Past Medical History:  Diagnosis Date   Anemia    Migraines    otc med prn   SVD (spontaneous vaginal delivery)    x 1   Past Surgical History:  Procedure Laterality Date   COLPOSCOPY  2004   neg paps since   DILATION AND CURETTAGE OF UTERUS N/A 10/26/2014   Procedure: SUCTION DILATATION AND CURETTAGE  ;  Surgeon: Delice Lesch, MD;  Location: [facility];  Service: Gynecology;  Laterality: N/A;   WISDOM TOOTH EXTRACTION     Family History  Problem Relation Age of Onset   Cancer Maternal Grandmother        throat   Social History   Tobacco Use   Smoking status: Never   Smokeless tobacco: Never  Vaping Use   Vaping Use: Never used  Substance Use Topics   Alcohol use: Yes    Comment: socially   Drug use: No    Current Outpatient Medications:    levonorgestrel (MIRENA) 20 MCG/24HR IUD, 1 each by Intrauterine route once., Disp: , Rfl:  Allergies: Patient has no known allergies.  Review of Systems  Constitutional:  Negative for chills, fever and malaise/fatigue.  HENT:  Negative for congestion, sinus pain and sore throat.   Eyes:  Negative for blurred vision and pain.  Respiratory:  Negative for cough and wheezing.   Cardiovascular:  Negative for chest pain and leg swelling.  Gastrointestinal:  Negative for abdominal pain, constipation, diarrhea, heartburn, nausea and vomiting.  Genitourinary:  Negative for dysuria, frequency, hematuria and urgency.  Musculoskeletal:  Negative for back pain, joint pain, myalgias and neck pain.  Skin:  Negative for itching and rash.  Neurological:  Negative for dizziness, tremors and weakness.  Endo/Heme/Allergies:  Does not bruise/bleed easily.  Psychiatric/Behavioral:  Negative for  depression. The patient is not nervous/anxious and does not have insomnia.    Objective: BP 112/74    Ht 5\' 5"  (1.651 m)    Wt 196 lb (88.9 kg)    BMI 32.62 kg/m   Filed Weights   10/11/21 1454  Weight: 196 lb (88.9 kg)   Physical Exam Constitutional:      General: She is not in acute distress.    Appearance: She is well-developed.  HENT:     Head: Normocephalic and atraumatic. No laceration.     Right Ear: Hearing normal.     Left Ear: Hearing normal.     Mouth/Throat:     Pharynx: Uvula midline.  Eyes:     Pupils: Pupils are equal, round, and reactive to light.  Neck:     Thyroid: No thyromegaly.  Cardiovascular:     Rate and Rhythm: Normal rate and regular rhythm.     Heart sounds: No murmur heard.   No friction rub. No gallop.  Pulmonary:     Effort: Pulmonary effort is normal. No respiratory distress.     Breath sounds: Normal breath sounds. No wheezing.  Abdominal:     General: Bowel sounds are normal. There is no distension.     Palpations: Abdomen is soft.     Tenderness: There is no abdominal tenderness. There is no rebound.  Musculoskeletal:        General: Normal range of motion.  Cervical back: Normal range of motion and neck supple.  Neurological:     Mental Status: She is alert and oriented to person, place, and time.     Cranial Nerves: No cranial nerve deficit.  Skin:    General: Skin is warm and dry.  Psychiatric:        Judgment: Judgment normal.  Vitals reviewed.    Assessment: 1. Sterilization consult   Plans laparoscopy w bilateral partial salpingectomy, and also then removal of IUD  The patient has been fully informed about all methods of contraception, both temporary and permanent. She understands that tubal ligation is meant to be permanent, absolute and irreversible. She was told that there is an approximately 1 in 400 chance of a pregnancy in the future after tubal ligation. She was told the short and long term complications of tubal  ligation. She understands the risks from this surgery include, but are not limited to, the risks of anesthesia, hemorrhage, infection, perforation, and injury to adjacent structures, bowel, bladder and blood vessels.   Barnett Applebaum, MD, [facility], [facility] 10/11/2021  2:59 PM

## 2021-10-17 ENCOUNTER — Encounter
Admission: RE | Admit: 2021-10-17 | Discharge: 2021-10-17 | Disposition: A | Discharge status: Home / Self Care | Payer: No Typology Code available for payment source | Source: Ambulatory Visit | Attending: Obstetrics & Gynecology | Admitting: Obstetrics & Gynecology

## 2021-10-17 ENCOUNTER — Other Ambulatory Visit: Payer: Self-pay

## 2021-10-17 NOTE — Patient Instructions (Signed)
Your procedure is scheduled on: 10/26/21 Report to DAY SURGERY DEPARTMENT LOCATED ON 2ND FLOOR MEDICAL MALL ENTRANCE. To find out your arrival time please call [phone] between 1PM - 3PM on 10/25/21.  Remember: Instructions that are not followed completely may result in serious medical risk, up to and including death, or upon the discretion of your surgeon and anesthesiologist your surgery may need to be rescheduled.     _X__ 1. Do not eat food or drink any liquids after midnight the night before your procedure.                 No gum chewing or hard candies.   __X__2.  On the morning of surgery brush your teeth with toothpaste and water, you                 may rinse your mouth with mouthwash if you wish.  Do not swallow any              toothpaste of mouthwash.     _X__ 3.  No Alcohol for 24 hours before or after surgery.   _X__ 4.  Do Not Smoke or use e-cigarettes For 24 Hours Prior to Your Surgery.                 Do not use any chewable tobacco products for at least 6 hours prior to                 surgery.  ____  5.  Bring all medications with you on the day of surgery if instructed.   __X__  6.  Notify your doctor if there is any change in your medical condition      (cold, fever, infections).     Do not wear jewelry, make-up, hairpins, clips or nail polish. Do not wear lotions, powders, or perfumes.  Do not shave body hair 48 hours prior to surgery. Men may shave face and neck. Do not bring valuables to the hospital.    [facility] is not responsible for any belongings or valuables.  Contacts, dentures/partials or body piercings may not be worn into surgery. Bring a case for your contacts, glasses or hearing aids, a denture cup will be supplied. Leave your suitcase in the car. After surgery it may be brought to your room. For patients admitted to the hospital, discharge time is determined by your treatment team.   Patients discharged the day of surgery will not be  allowed to drive home.   Please read over the following fact sheets that you were given:   CHG soap, incentive spirometer  __X__ Take these medicines the morning of surgery with A SIP OF WATER:    1. none  2.   3.   4.  5.  6.  ____ Fleet Enema (as directed)   __X__ Use CHG Soap/SAGE wipes as directed  ____ Use inhalers on the day of surgery  ____ Stop metformin/Janumet/Farxiga 2 days prior to surgery    ____ Take 1/2 of usual insulin dose the night before surgery. No insulin the morning          of surgery.   ____ Stop Blood Thinners Coumadin/Plavix/Xarelto/Pleta/Pradaxa/Eliquis/Effient/Aspirin  on   Or contact your Surgeon, Cardiologist or Medical Doctor regarding  ability to stop your blood thinners  __X__ Stop Anti-inflammatories 7 days before surgery such as Advil, Ibuprofen, Motrin,  BC or Goodies Powder, Naprosyn, Naproxen, Aleve, Aspirin    __X__ Stop all herbals and supplements,  fish oil or vitamins for 7 days until after surgery.    ____ Bring C-Pap to the hospital.    How to Use an Incentive Spirometer An incentive spirometer is a tool that measures how well you are filling your lungs with each breath. Learning to take long, deep breaths using this tool can help you keep your lungs clear and active. This may help to reverse or lessen your chance of developing breathing (pulmonary) problems, especially infection. You may be asked to use a spirometer: After a surgery. If you have a lung problem or a history of smoking. After a long period of time when you have been unable to move or be active. If the spirometer includes an indicator to show the highest number that you have reached, your health care provider or respiratory therapist will help you set a goal. Keep a log of your progress as told by your health care provider. What are the risks? Breathing too quickly may cause dizziness or cause you to pass out. Take your time so you do not get dizzy or light-headed. If  you are in pain, you may need to take pain medicine before doing incentive spirometry. It is harder to take a deep breath if you are having pain. How to use your incentive spirometer  Sit up on the edge of your bed or on a chair. Hold the incentive spirometer so that it is in an upright position. Before you use the spirometer, breathe out normally. Place the mouthpiece in your mouth. Make sure your lips are closed tightly around it. Breathe in slowly and as deeply as you can through your mouth, causing the piston or the ball to rise toward the top of the chamber. Hold your breath for 3-5 seconds, or for as long as possible. If the spirometer includes a coach indicator, use this to guide you in breathing. Slow down your breathing if the indicator goes above the marked areas. Remove the mouthpiece from your mouth and breathe out normally. The piston or ball will return to the bottom of the chamber. Rest for a few seconds, then repeat the steps 10 or more times. Take your time and take a few normal breaths between deep breaths so that you do not get dizzy or light-headed. Do this every 1-2 hours when you are awake. If the spirometer includes a goal marker to show the highest number you have reached (best effort), use this as a goal to work toward during each repetition. After each set of 10 deep breaths, cough a few times. This will help to make sure that your lungs are clear. If you have an incision on your chest or abdomen from surgery, place a pillow or a rolled-up towel firmly against the incision when you cough. This can help to reduce pain while taking deep breaths and coughing. General tips When you are able to get out of bed: Walk around often. Continue to take deep breaths and cough in order to clear your lungs. Keep using the incentive spirometer until your health care provider says it is okay to stop using it. If you have been in the hospital, you may be told to keep using the spirometer  at home. Contact a health care provider if: You are having difficulty using the spirometer. You have trouble using the spirometer as often as instructed. Your pain medicine is not giving enough relief for you to use the spirometer as told. You have a fever. Get help right away if: You  develop shortness of breath. You develop a cough with bloody mucus from the lungs. You have fluid or blood coming from an incision site after you cough. Summary An incentive spirometer is a tool that can help you learn to take long, deep breaths to keep your lungs clear and active. You may be asked to use a spirometer after a surgery, if you have a lung problem or a history of smoking, or if you have been inactive for a long period of time. Use your incentive spirometer as instructed every 1-2 hours while you are awake. If you have an incision on your chest or abdomen, place a pillow or a rolled-up towel firmly against your incision when you cough. This will help to reduce pain. Get help right away if you have shortness of breath, you cough up bloody mucus, or blood comes from your incision when you cough. This information is not intended to replace advice given to you by your health care provider. Make sure you discuss any questions you have with your health care provider. Document Revised: 11/09/2019 Document Reviewed: 11/09/2019 Elsevier Patient Education  2022 [employer].

## 2021-10-18 ENCOUNTER — Encounter: Payer: Self-pay | Admitting: Urgent Care

## 2021-10-18 ENCOUNTER — Encounter
Admission: RE | Admit: 2021-10-18 | Discharge: 2021-10-18 | Disposition: A | Discharge status: Home / Self Care | Payer: BC Managed Care – PPO | Source: Ambulatory Visit | Attending: Obstetrics & Gynecology | Admitting: Obstetrics & Gynecology

## 2021-10-18 DIAGNOSIS — Z3009 Encounter for other general counseling and advice on contraception: Secondary | ICD-10-CM | POA: Insufficient documentation

## 2021-10-18 DIAGNOSIS — Z01812 Encounter for preprocedural laboratory examination: Secondary | ICD-10-CM | POA: Diagnosis not present

## 2021-10-18 LAB — CBC
HCT: 37.1 % (ref 36.0–46.0)
Hemoglobin: 11.4 g/dL — ABNORMAL LOW (ref 12.0–15.0)
MCH: 28.6 pg (ref 26.0–34.0)
MCHC: 30.7 g/dL (ref 30.0–36.0)
MCV: 93.2 fL (ref 80.0–100.0)
Platelets: 240 10*3/uL (ref 150–400)
RBC: 3.98 MIL/uL (ref 3.87–5.11)
RDW: 13.5 % (ref 11.5–15.5)
WBC: 5.3 10*3/uL (ref 4.0–10.5)
nRBC: 0 % (ref 0.0–0.2)

## 2021-10-18 LAB — TYPE AND SCREEN
ABO/RH(D): O POS
Antibody Screen: NEGATIVE

## 2021-10-26 ENCOUNTER — Other Ambulatory Visit: Payer: Self-pay

## 2021-10-26 ENCOUNTER — Ambulatory Visit
Admission: RE | Admit: 2021-10-26 | Discharge: 2021-10-26 | Disposition: A | Discharge status: Home / Self Care | Payer: BC Managed Care – PPO | Attending: Obstetrics & Gynecology | Admitting: Obstetrics & Gynecology

## 2021-10-26 ENCOUNTER — Encounter
Admission: RE | Disposition: A | Discharge status: Home / Self Care | Payer: Self-pay | Source: Home / Self Care | Attending: Obstetrics & Gynecology

## 2021-10-26 ENCOUNTER — Ambulatory Visit: Payer: BC Managed Care – PPO

## 2021-10-26 ENCOUNTER — Encounter: Payer: Self-pay | Admitting: Obstetrics & Gynecology

## 2021-10-26 DIAGNOSIS — Z30432 Encounter for removal of intrauterine contraceptive device: Secondary | ICD-10-CM

## 2021-10-26 DIAGNOSIS — Z302 Encounter for sterilization: Secondary | ICD-10-CM

## 2021-10-26 DIAGNOSIS — N838 Other noninflammatory disorders of ovary, fallopian tube and broad ligament: Secondary | ICD-10-CM | POA: Insufficient documentation

## 2021-10-26 HISTORY — PX: LAPAROSCOPIC BILATERAL SALPINGECTOMY: SHX5889

## 2021-10-26 HISTORY — PX: IUD REMOVAL: SHX5392

## 2021-10-26 LAB — POCT PREGNANCY, URINE: Preg Test, Ur: NEGATIVE

## 2021-10-26 SURGERY — SALPINGECTOMY, BILATERAL, LAPAROSCOPIC
Anesthesia: General | Site: Abdomen

## 2021-10-26 MED ORDER — FAMOTIDINE 20 MG PO TABS
ORAL_TABLET | ORAL | Status: AC
  Administered 2021-10-26: 20 mg via ORAL
  Filled 2021-10-26: qty 1

## 2021-10-26 MED ORDER — ACETAMINOPHEN 650 MG RE SUPP
650.0000 mg | RECTAL | Status: DC | PRN
  Filled 2021-10-26: qty 1

## 2021-10-26 MED ORDER — ROCURONIUM BROMIDE 10 MG/ML (PF) SYRINGE
PREFILLED_SYRINGE | INTRAVENOUS | Status: AC
  Filled 2021-10-26: qty 10

## 2021-10-26 MED ORDER — ONDANSETRON HCL 4 MG/2ML IJ SOLN
INTRAMUSCULAR | Status: AC
  Filled 2021-10-26: qty 2

## 2021-10-26 MED ORDER — PROPOFOL 10 MG/ML IV BOLUS
INTRAVENOUS | Status: DC | PRN
  Administered 2021-10-26: 160 mg via INTRAVENOUS
  Administered 2021-10-26: 150 ug/kg/min via INTRAVENOUS

## 2021-10-26 MED ORDER — LIDOCAINE HCL (PF) 2 % IJ SOLN
INTRAMUSCULAR | Status: AC
  Filled 2021-10-26: qty 5

## 2021-10-26 MED ORDER — LIDOCAINE HCL (CARDIAC) PF 100 MG/5ML IV SOSY
PREFILLED_SYRINGE | INTRAVENOUS | Status: DC | PRN
Start: 2021-10-26 — End: 2021-10-26
  Administered 2021-10-26: 100 mg via INTRAVENOUS

## 2021-10-26 MED ORDER — PROPOFOL 500 MG/50ML IV EMUL
INTRAVENOUS | Status: AC
  Filled 2021-10-26: qty 50

## 2021-10-26 MED ORDER — KETOROLAC TROMETHAMINE 30 MG/ML IJ SOLN
INTRAMUSCULAR | Status: AC
  Filled 2021-10-26: qty 1

## 2021-10-26 MED ORDER — OXYCODONE-ACETAMINOPHEN 5-325 MG PO TABS: 1.0000 | ORAL_TABLET | ORAL | Status: DC | PRN

## 2021-10-26 MED ORDER — OXYCODONE HCL 5 MG/5ML PO SOLN: 5.0000 mg | Freq: Once | ORAL | Status: AC | PRN

## 2021-10-26 MED ORDER — POVIDONE-IODINE 10 % EX SWAB
2.0000 "application " | Freq: Once | CUTANEOUS | Status: AC
  Administered 2021-10-26: 2 via TOPICAL

## 2021-10-26 MED ORDER — MIDAZOLAM HCL 2 MG/2ML IJ SOLN
INTRAMUSCULAR | Status: AC
  Filled 2021-10-26: qty 2

## 2021-10-26 MED ORDER — KETAMINE HCL 50 MG/ML IJ SOLN
INTRAMUSCULAR | Status: DC | PRN
Start: 2021-10-26 — End: 2021-10-26
  Administered 2021-10-26: 40 mg via INTRAMUSCULAR

## 2021-10-26 MED ORDER — ROCURONIUM BROMIDE 100 MG/10ML IV SOLN
INTRAVENOUS | Status: DC | PRN
  Administered 2021-10-26: 50 mg via INTRAVENOUS

## 2021-10-26 MED ORDER — PROPOFOL 10 MG/ML IV BOLUS
INTRAVENOUS | Status: AC
  Filled 2021-10-26: qty 40

## 2021-10-26 MED ORDER — KETAMINE HCL 50 MG/5ML IJ SOSY
PREFILLED_SYRINGE | INTRAMUSCULAR | Status: AC
  Filled 2021-10-26: qty 5

## 2021-10-26 MED ORDER — DEXMEDETOMIDINE (PRECEDEX) IN NS 20 MCG/5ML (4 MCG/ML) IV SYRINGE
PREFILLED_SYRINGE | INTRAVENOUS | Status: DC | PRN
  Administered 2021-10-26: 8 ug via INTRAVENOUS
  Administered 2021-10-26 (×3): 4 ug via INTRAVENOUS

## 2021-10-26 MED ORDER — ONDANSETRON HCL 4 MG/2ML IJ SOLN
INTRAMUSCULAR | Status: DC | PRN
  Administered 2021-10-26: 4 mg via INTRAVENOUS

## 2021-10-26 MED ORDER — DEXAMETHASONE SODIUM PHOSPHATE 10 MG/ML IJ SOLN
INTRAMUSCULAR | Status: AC
  Filled 2021-10-26: qty 1

## 2021-10-26 MED ORDER — PROPOFOL 10 MG/ML IV BOLUS
INTRAVENOUS | Status: AC
  Filled 2021-10-26: qty 20

## 2021-10-26 MED ORDER — LACTATED RINGERS IV SOLN: INTRAVENOUS | Status: DC

## 2021-10-26 MED ORDER — OXYCODONE HCL 5 MG PO TABS
ORAL_TABLET | ORAL | Status: AC
  Administered 2021-10-26: 5 mg via ORAL
  Filled 2021-10-26: qty 1

## 2021-10-26 MED ORDER — CHLORHEXIDINE GLUCONATE 0.12 % MT SOLN
15.0000 mL | Freq: Once | OROMUCOSAL | Status: AC
Start: 2021-10-26 — End: 2021-10-26

## 2021-10-26 MED ORDER — CHLORHEXIDINE GLUCONATE 0.12 % MT SOLN
OROMUCOSAL | Status: AC
  Administered 2021-10-26: 15 mL via OROMUCOSAL
  Filled 2021-10-26: qty 15

## 2021-10-26 MED ORDER — MIDAZOLAM HCL 2 MG/2ML IJ SOLN
INTRAMUSCULAR | Status: DC | PRN
  Administered 2021-10-26: 2 mg via INTRAVENOUS

## 2021-10-26 MED ORDER — OXYCODONE HCL 5 MG PO TABS: 5.0000 mg | ORAL_TABLET | Freq: Once | ORAL | Status: AC | PRN

## 2021-10-26 MED ORDER — FENTANYL CITRATE (PF) 100 MCG/2ML IJ SOLN
INTRAMUSCULAR | Status: AC
  Administered 2021-10-26: 25 ug via INTRAVENOUS
  Filled 2021-10-26: qty 2

## 2021-10-26 MED ORDER — BUPIVACAINE HCL (PF) 0.5 % IJ SOLN
INTRAMUSCULAR | Status: DC | PRN
  Administered 2021-10-26: 10 mL

## 2021-10-26 MED ORDER — 0.9 % SODIUM CHLORIDE (POUR BTL) OPTIME
TOPICAL | Status: DC | PRN
  Administered 2021-10-26: 500 mL

## 2021-10-26 MED ORDER — BUPIVACAINE HCL (PF) 0.5 % IJ SOLN
INTRAMUSCULAR | Status: AC
  Filled 2021-10-26: qty 30

## 2021-10-26 MED ORDER — ACETAMINOPHEN 325 MG PO TABS
650.0000 mg | ORAL_TABLET | ORAL | Status: DC | PRN
  Administered 2021-10-26: 650 mg via ORAL

## 2021-10-26 MED ORDER — KETOROLAC TROMETHAMINE 30 MG/ML IJ SOLN
INTRAMUSCULAR | Status: DC | PRN
  Administered 2021-10-26: 15 mg via INTRAVENOUS

## 2021-10-26 MED ORDER — ORAL CARE MOUTH RINSE
15.0000 mL | Freq: Once | OROMUCOSAL | Status: AC
Start: 2021-10-26 — End: 2021-10-26

## 2021-10-26 MED ORDER — FAMOTIDINE 20 MG PO TABS: 20.0000 mg | ORAL_TABLET | Freq: Once | ORAL | Status: AC

## 2021-10-26 MED ORDER — MORPHINE SULFATE (PF) 2 MG/ML IV SOLN: 1.0000 mg | INTRAVENOUS | Status: DC | PRN

## 2021-10-26 MED ORDER — ACETAMINOPHEN 325 MG PO TABS
ORAL_TABLET | ORAL | Status: AC
  Filled 2021-10-26: qty 2

## 2021-10-26 MED ORDER — PHENYLEPHRINE HCL-NACL 20-0.9 MG/250ML-% IV SOLN
INTRAVENOUS | Status: DC | PRN
  Administered 2021-10-26: 20 ug/min via INTRAVENOUS

## 2021-10-26 MED ORDER — FENTANYL CITRATE (PF) 100 MCG/2ML IJ SOLN
INTRAMUSCULAR | Status: AC
  Filled 2021-10-26: qty 2

## 2021-10-26 MED ORDER — OXYCODONE-ACETAMINOPHEN 5-325 MG PO TABS: 1.0000 | ORAL_TABLET | ORAL | 0 refills | Status: DC | PRN

## 2021-10-26 MED ORDER — FENTANYL CITRATE (PF) 100 MCG/2ML IJ SOLN
INTRAMUSCULAR | Status: DC | PRN
  Administered 2021-10-26 (×2): 50 ug via INTRAVENOUS

## 2021-10-26 MED ORDER — SUGAMMADEX SODIUM 200 MG/2ML IV SOLN
INTRAVENOUS | Status: DC | PRN
  Administered 2021-10-26: 200 mg via INTRAVENOUS

## 2021-10-26 MED ORDER — FENTANYL CITRATE (PF) 100 MCG/2ML IJ SOLN: 25.0000 ug | INTRAMUSCULAR | Status: DC | PRN

## 2021-10-26 MED ORDER — DEXAMETHASONE SODIUM PHOSPHATE 10 MG/ML IJ SOLN
INTRAMUSCULAR | Status: DC | PRN
  Administered 2021-10-26: 8 mg via INTRAVENOUS

## 2021-10-26 SURGICAL SUPPLY — 53 items
BACTOSHIELD CHG 4% 4OZ (MISCELLANEOUS) ×1
BLADE SURG SZ11 CARB STEEL (BLADE) ×3 IMPLANT
CATH ROBINSON RED A/P 16FR (CATHETERS) ×3 IMPLANT
CHLORAPREP W/TINT 26 (MISCELLANEOUS) ×2 IMPLANT
DERMABOND ADVANCED (GAUZE/BANDAGES/DRESSINGS) ×1
DERMABOND ADVANCED .7 DNX12 (GAUZE/BANDAGES/DRESSINGS) ×2 IMPLANT
DRSG TELFA 3X8 NADH (GAUZE/BANDAGES/DRESSINGS) ×3 IMPLANT
DRSG TELFA 4X3 1S NADH ST (GAUZE/BANDAGES/DRESSINGS) IMPLANT
GAUZE 4X4 16PLY ~~LOC~~+RFID DBL (SPONGE) ×5 IMPLANT
GLOVE SURG ENC MOIS LTX SZ7.5 (GLOVE) ×3 IMPLANT
GLOVE SURG ENC MOIS LTX SZ8 (GLOVE) ×6 IMPLANT
GLOVE SURG UNDER LTX SZ8 (GLOVE) ×4 IMPLANT
GOWN STRL REUS W/ TWL LRG LVL3 (GOWN DISPOSABLE) ×2 IMPLANT
GOWN STRL REUS W/ TWL XL LVL3 (GOWN DISPOSABLE) ×2 IMPLANT
GOWN STRL REUS W/TWL LRG LVL3 (GOWN DISPOSABLE) ×1
GOWN STRL REUS W/TWL XL LVL3 (GOWN DISPOSABLE) ×1
GRASPER SUT TROCAR 14GX15 (MISCELLANEOUS) ×2 IMPLANT
IRRIGATION STRYKERFLOW (MISCELLANEOUS) IMPLANT
IRRIGATOR STRYKERFLOW (MISCELLANEOUS)
IV LACTATED RINGERS 1000ML (IV SOLUTION) IMPLANT
KIT PINK PAD W/HEAD ARE REST (MISCELLANEOUS) ×3
KIT PINK PAD W/HEAD ARM REST (MISCELLANEOUS) ×2 IMPLANT
LABEL OR SOLS (LABEL) ×3 IMPLANT
MANIFOLD NEPTUNE II (INSTRUMENTS) ×3 IMPLANT
NEEDLE VERESS 14GA 120MM (NEEDLE) ×3 IMPLANT
NS IRRIG 500ML POUR BTL (IV SOLUTION) ×3 IMPLANT
PACK DNC HYST (MISCELLANEOUS) ×3 IMPLANT
PACK GYN LAPAROSCOPIC (MISCELLANEOUS) ×3 IMPLANT
PAD DRESSING TELFA 3X8 NADH (GAUZE/BANDAGES/DRESSINGS) IMPLANT
PAD OB MATERNITY 4.3X12.25 (PERSONAL CARE ITEMS) ×3 IMPLANT
PAD PREP 24X41 OB/GYN DISP (PERSONAL CARE ITEMS) ×3 IMPLANT
POUCH SPECIMEN RETRIEVAL 10MM (ENDOMECHANICALS) IMPLANT
SCISSORS METZENBAUM CVD 33 (INSTRUMENTS) ×2 IMPLANT
SCRUB CHG 4% DYNA-HEX 4OZ (MISCELLANEOUS) ×2 IMPLANT
SET CYSTO W/LG BORE CLAMP LF (SET/KITS/TRAYS/PACK) IMPLANT
SET TUBE SMOKE EVAC HIGH FLOW (TUBING) ×2 IMPLANT
SHEARS HARMONIC ACE PLUS 36CM (ENDOMECHANICALS) ×1 IMPLANT
SLEEVE ENDOPATH XCEL 5M (ENDOMECHANICALS) IMPLANT
SLEEVE Z-THREAD 5X100MM (TROCAR) ×2 IMPLANT
SOL PREP PVP 2OZ (MISCELLANEOUS)
SOLUTION PREP PVP 2OZ (MISCELLANEOUS) ×2 IMPLANT
SPONGE GAUZE 2X2 8PLY STRL LF (GAUZE/BANDAGES/DRESSINGS) IMPLANT
STRAP SAFETY 5IN WIDE (MISCELLANEOUS) ×2 IMPLANT
SUT VIC AB 0 CT1 36 (SUTURE) ×2 IMPLANT
SUT VIC AB 2-0 UR6 27 (SUTURE) IMPLANT
SUT VIC AB 4-0 PS2 18 (SUTURE) IMPLANT
SYR 10ML LL (SYRINGE) ×3 IMPLANT
SYSTEM WECK SHIELD CLOSURE (TROCAR) IMPLANT
TOWEL OR 17X26 4PK STRL BLUE (TOWEL DISPOSABLE) ×3 IMPLANT
TROCAR ENDO BLADELESS 11MM (ENDOMECHANICALS) IMPLANT
TROCAR XCEL NON-BLD 5MMX100MML (ENDOMECHANICALS) ×2 IMPLANT
TROCAR Z-THREAD FIOS 5X100MM (TROCAR) ×1 IMPLANT
WATER STERILE IRR 500ML POUR (IV SOLUTION) ×2 IMPLANT

## 2021-10-26 NOTE — Anesthesia Preprocedure Evaluation (Signed)
Anesthesia Evaluation  Patient identified by MRN, date of birth, ID band Patient awake    Reviewed: Allergy & Precautions, NPO status , Patient's Chart, lab work & pertinent test results  History of Anesthesia Complications Negative for: history of anesthetic complications  Airway Mallampati: III  TM Distance: >3 FB Neck ROM: full    Dental  (+) Chipped   Pulmonary neg pulmonary ROS, neg shortness of breath,    Pulmonary exam normal        Cardiovascular Exercise Tolerance: Good (-) angina(-) Past MI negative cardio ROS Normal cardiovascular exam     Neuro/Psych  Headaches, negative psych ROS   GI/Hepatic negative GI ROS, Neg liver ROS, neg GERD  ,  Endo/Other  negative endocrine ROS  Renal/GU      Musculoskeletal   Abdominal   Peds  Hematology negative hematology ROS (+)   Anesthesia Other Findings Past Medical History: No date: Anemia No date: Migraines     Comment:  otc med prn No date: SVD (spontaneous vaginal delivery)     Comment:  x 1  Past Surgical History: 2004: COLPOSCOPY     Comment:  neg paps since 10/26/2014: DILATION AND CURETTAGE OF UTERUS; N/A     Comment:  Procedure: SUCTION DILATATION AND CURETTAGE  ;  Surgeon:              Purcell Nails, MD;  Location: [facility];  Service:               Gynecology;  Laterality: N/A; No date: WISDOM TOOTH EXTRACTION  BMI    Body Mass Index: 32.45 kg/m      Reproductive/Obstetrics negative OB ROS                             Anesthesia Physical Anesthesia Plan  ASA: 2  Anesthesia Plan: General ETT   Post-op Pain Management:    Induction: Intravenous  PONV Risk Score and Plan: Ondansetron, Dexamethasone, Midazolam and Treatment may vary due to age or medical condition  Airway Management Planned: Oral ETT  Additional Equipment:   Intra-op Plan:   Post-operative Plan: Extubation in OR  Informed Consent: I have  reviewed the patients History and Physical, chart, labs and discussed the procedure including the risks, benefits and alternatives for the proposed anesthesia with the patient or authorized representative who has indicated his/her understanding and acceptance.     Dental Advisory Given  Plan Discussed with: Anesthesiologist, CRNA and Surgeon  Anesthesia Plan Comments: (Patient consented for risks of anesthesia including but not limited to:  - adverse reactions to medications - damage to eyes, teeth, lips or other oral mucosa - nerve damage due to positioning  - sore throat or hoarseness - Damage to heart, brain, nerves, lungs, other parts of body or loss of life  Patient voiced understanding.)        Anesthesia Quick Evaluation

## 2021-10-26 NOTE — Transfer of Care (Signed)
Immediate Anesthesia Transfer of Care Note  Patient: Autumn Evans  Procedure(s) Performed: LAPAROSCOPIC BILATERAL SALPINGECTOMY (Bilateral) INTRAUTERINE DEVICE (IUD) REMOVAL  Patient Location: PACU  Anesthesia Type:General  Level of Consciousness: drowsy  Airway & Oxygen Therapy: Patient Spontanous Breathing and Patient connected to face mask oxygen  Post-op Assessment: Report given to RN and Post -op Vital signs reviewed and stable  Post vital signs: Reviewed and stable  Last Vitals:  Vitals Value Taken Time  BP 103/69 10/26/21 1043  Temp 36.4 C 10/26/21 1043  Pulse 78 10/26/21 1045  Resp 21 10/26/21 1045  SpO2 95 % 10/26/21 1045  Vitals shown include unvalidated device data.  Last Pain:  Vitals:   10/26/21 0801  TempSrc: Temporal  PainSc: 0-No pain         Complications: No notable events documented.

## 2021-10-26 NOTE — Interval H&P Note (Signed)
History and Physical Interval Note:  10/26/2021 8:24 AM  Autumn Evans  has presented today for surgery, with the diagnosis of Sterliazation.  The various methods of treatment have been discussed with the patient and family. After consideration of risks, benefits and other options for treatment, the patient has consented to  Procedure(s): LAPAROSCOPIC BILATERAL SALPINGECTOMY (Bilateral) INTRAUTERINE DEVICE (IUD) REMOVAL (N/A) as a surgical intervention.  The patient's history has been reviewed, patient examined, no change in status, stable for surgery.  I have reviewed the patient's chart and labs.  Questions were answered to the patient's satisfaction.     Letitia Libra

## 2021-10-26 NOTE — Op Note (Addendum)
°  Operative Note   10/26/2021  PRE-OP DIAGNOSIS: Desire for permanent sterilization  POST-OP DIAGNOSIS: same   PROCEDURE: Procedure(s): LAPAROSCOPIC BILATERAL SALPINGECTOMY INTRAUTERINE DEVICE (IUD) REMOVAL   SURGEON: Annamarie Major, MD, FACOG  ANESTHESIA: General   ESTIMATED BLOOD LOSS: Min  COMPLICATIONS: None  DISPOSITION: PACU - hemodynamically stable.  CONDITION: stable  FINDINGS: Laparoscopic survey of the abdomen revealed a grossly normal uterus, tubes, ovaries, liver edge, gallbladder edge and appendix, No intra-abdominal adhesions were noted.  PROCEDURE IN DETAIL: The patient was taken to the OR where anesthesia was administed. The patient was positioned in dorsal lithotomy in the [place] stirrups. The patient was then examined under anesthesia with the above noted findings. The patient was prepped and draped in the normal sterile fashion and bladder was drained using a red rubber cathater. Speculum exam normal, IUD is seen and removed, and a sponge stick was placed for manipulation purposes.  Attention was turned to the patients abdomen where a 5 mm skin incision was made in the umbilical fold, after injection of local anesthesia. The Veress step needle was carefully introduced into the peritoneal cavity with placement confirmed using the hanging drop technique.  Pneumoperitoneum was obtained. The 5 mm port was then placed under direct visualization with the operative laparoscope  The above noted findings.  Trendelenburg.  A 5 mm trocar was then placed in the right lower quadrant under direct visualization with the laparoscope. Also one placed in suprapubic region.  Right and left fallopian tubes are identified and followed out to their fimbria.  Each tube is excised utilizing the Harmonic scapel to include the fibria.  No injuries or bleeding was noted.  All instruments and ports were then removed from the abdomen after gas was expelled and patient was leveled.   The skin was  closed with skin adhesive. The patient tolerated the procedure well. All counts were correct x 2. The patient was transferred to the recovery room awake, alert and breathing independently.  Annamarie Major, MD, [facility], [facility] 10/26/2021  10:41 AM

## 2021-10-26 NOTE — Anesthesia Procedure Notes (Signed)
Procedure Name: Intubation Date/Time: 10/26/2021 10:05 AM Performed by: Waunita Schooner, RN Pre-anesthesia Checklist: Patient identified, Emergency Drugs available, Suction available and Patient being monitored Patient Re-evaluated:Patient Re-evaluated prior to induction Oxygen Delivery Method: Circle system utilized Preoxygenation: Pre-oxygenation with 100% oxygen Induction Type: IV induction Ventilation: Mask ventilation without difficulty Laryngoscope Size: McGraph and 3 Grade View: Grade I Tube type: Oral Tube size: 7.0 mm Number of attempts: 1 Airway Equipment and Method: Stylet and Oral airway Placement Confirmation: ETT inserted through vocal cords under direct vision, positive ETCO2 and breath sounds checked- equal and bilateral Secured at: 21 cm Tube secured with: Tape Dental Injury: Teeth and Oropharynx as per pre-operative assessment

## 2021-10-26 NOTE — Discharge Instructions (Signed)

## 2021-10-26 NOTE — Anesthesia Postprocedure Evaluation (Signed)
Anesthesia Post Note  Patient: Sayra Frisby  Procedure(s) Performed: LAPAROSCOPIC BILATERAL SALPINGECTOMY (Bilateral: Abdomen) INTRAUTERINE DEVICE (IUD) REMOVAL (Abdomen)  Patient location during evaluation: PACU Anesthesia Type: General Level of consciousness: awake and alert Pain management: pain level controlled Vital Signs Assessment: post-procedure vital signs reviewed and stable Respiratory status: spontaneous breathing, nonlabored ventilation and respiratory function stable Cardiovascular status: blood pressure returned to baseline and stable Postop Assessment: no apparent nausea or vomiting Anesthetic complications: no   No notable events documented.   Last Vitals:  Vitals:   10/26/21 1145 10/26/21 1206  BP: 103/85 110/83  Pulse: 78 75  Resp: 11 16  Temp: (!) 36.4 C (!) 36.4 C  SpO2: 100% 100%    Last Pain:  Vitals:   10/26/21 1206  TempSrc: Temporal  PainSc: 0-No pain                 Foye Deer

## 2021-10-27 ENCOUNTER — Encounter: Payer: Self-pay | Admitting: Obstetrics & Gynecology

## 2021-10-27 LAB — SURGICAL PATHOLOGY

## 2021-10-31 ENCOUNTER — Ambulatory Visit (INDEPENDENT_AMBULATORY_CARE_PROVIDER_SITE_OTHER): Payer: BC Managed Care – PPO | Admitting: Obstetrics & Gynecology

## 2021-10-31 ENCOUNTER — Encounter: Payer: Self-pay | Admitting: Obstetrics & Gynecology

## 2021-10-31 ENCOUNTER — Other Ambulatory Visit: Payer: Self-pay

## 2021-10-31 VITALS — BP 120/80 | Ht 65.0 in | Wt 194.0 lb

## 2021-10-31 DIAGNOSIS — Z9851 Tubal ligation status: Secondary | ICD-10-CM

## 2021-10-31 NOTE — Progress Notes (Signed)
°  Postoperative Follow-up Patient presents post op from laparoscopy and BTL  for requested sterilization, 1 week ago.  Subjective: Patient reports marked improvement in her preop symptoms. Eating a regular diet without difficulty. The patient is not having any pain.  Activity: normal activities of daily living. Patient reports additional symptom's since surgery of None.  Objective: BP 120/80    Ht 5\' 5"  (1.651 m)    Wt 194 lb (88 kg)    LMP 10/17/2021    BMI 32.28 kg/m  Physical Exam Constitutional:      General: She is not in acute distress.    Appearance: She is well-developed.  Cardiovascular:     Rate and Rhythm: Normal rate.  Pulmonary:     Effort: Pulmonary effort is normal.  Abdominal:     General: There is no distension.     Palpations: Abdomen is soft.     Tenderness: There is no abdominal tenderness.     Comments: Incision Healing Well   Musculoskeletal:        General: Normal range of motion.  Neurological:     Mental Status: She is alert and oriented to person, place, and time.     Cranial Nerves: No cranial nerve deficit.  Skin:    General: Skin is warm and dry.    Assessment: s/p :  laparoscopy and tubal ligation progressing well  Plan: Patient has done well after surgery with no apparent complications.  I have discussed the post-operative course to date, and the expected progress moving forward.  The patient understands what complications to be concerned about.  I will see the patient in routine follow up, or sooner if needed.    Activity plan: No restriction.  10/19/2021 10/31/2021, 2:10 PM

## 2022-03-19 DIAGNOSIS — Z113 Encounter for screening for infections with a predominantly sexual mode of transmission: Secondary | ICD-10-CM | POA: Diagnosis not present

## 2022-08-23 DIAGNOSIS — B9689 Other specified bacterial agents as the cause of diseases classified elsewhere: Secondary | ICD-10-CM | POA: Diagnosis not present

## 2022-08-23 DIAGNOSIS — Z113 Encounter for screening for infections with a predominantly sexual mode of transmission: Secondary | ICD-10-CM | POA: Diagnosis not present

## 2022-08-23 DIAGNOSIS — N760 Acute vaginitis: Secondary | ICD-10-CM | POA: Diagnosis not present

## 2022-12-12 ENCOUNTER — Other Ambulatory Visit: Payer: Self-pay

## 2022-12-12 ENCOUNTER — Emergency Department
Admission: EM | Admit: 2022-12-12 | Discharge: 2022-12-12 | Disposition: A | Discharge status: Home / Self Care | Payer: BC Managed Care – PPO | Attending: Emergency Medicine | Admitting: Emergency Medicine

## 2022-12-12 ENCOUNTER — Encounter: Payer: Self-pay | Admitting: Intensive Care

## 2022-12-12 DIAGNOSIS — M5442 Lumbago with sciatica, left side: Secondary | ICD-10-CM | POA: Diagnosis not present

## 2022-12-12 DIAGNOSIS — M545 Low back pain, unspecified: Secondary | ICD-10-CM | POA: Diagnosis not present

## 2022-12-12 MED ORDER — KETOROLAC TROMETHAMINE 15 MG/ML IJ SOLN
15.0000 mg | Freq: Once | INTRAMUSCULAR | Status: DC
  Filled 2022-12-12: qty 1

## 2022-12-12 MED ORDER — IBUPROFEN 400 MG PO TABS
400.0000 mg | ORAL_TABLET | Freq: Once | ORAL | Status: AC
  Administered 2022-12-12: 400 mg via ORAL
  Filled 2022-12-12: qty 1

## 2022-12-12 MED ORDER — OXYCODONE HCL 5 MG PO TABS: 5.0000 mg | ORAL_TABLET | Freq: Three times a day (TID) | ORAL | 0 refills | Status: AC | PRN

## 2022-12-12 MED ORDER — OXYCODONE-ACETAMINOPHEN 5-325 MG PO TABS
1.0000 | ORAL_TABLET | Freq: Once | ORAL | Status: AC
  Administered 2022-12-12: 1 via ORAL
  Filled 2022-12-12: qty 1

## 2022-12-12 MED ORDER — LIDOCAINE 5 % EX PTCH
1.0000 | MEDICATED_PATCH | CUTANEOUS | Status: DC
  Administered 2022-12-12: 1 via TRANSDERMAL
  Filled 2022-12-12: qty 1

## 2022-12-12 NOTE — ED Provider Notes (Signed)
[facility] Provider Note    Event Date/Time   First MD Initiated Contact with Patient 12/12/22 1002     (approximate)   History   Sciatica   HPI  Autumn Evans is a 37 y.o. female past medical history of anemia who presents with low back pain.  Symptoms started on Monday.  Denies any precipitating factor.  Pain is located in the left low back radiates down to around the knee.  No numbness or weakness in her leg.  She denies bowel or bladder incontinence.  Denies history of similar.  No fevers chills.  No IV drug use.  Pain is worse when she sits down.  Has been able to ambulate.  She did take Percocet for the pain which she had leftover from prior surgery.     Past Medical History:  Diagnosis Date   Anemia    Migraines    otc med prn   SVD (spontaneous vaginal delivery)    x 1    Patient Active Problem List   Diagnosis Date Noted   Admission for sterilization 10/26/2021   Bacterial vaginosis 08/12/2019   Migraine without aura 11/23/2013     Physical Exam  Triage Vital Signs: ED Triage Vitals  Enc Vitals Group     BP 12/12/22 0959 (!) 134/109     Pulse Rate 12/12/22 0959 85     Resp 12/12/22 0959 18     Temp 12/12/22 0959 98.6 F (37 C)     Temp Source 12/12/22 0959 Oral     SpO2 12/12/22 0959 100 %     Weight 12/12/22 1000 190 lb (86.2 kg)     Height 12/12/22 1000 5\' 5"  (1.651 m)     Head Circumference --      Peak Flow --      Pain Score 12/12/22 1000 10     Pain Loc --      Pain Edu? --      Excl. in GC? --     Most recent vital signs: Vitals:   12/12/22 0959  BP: (!) 134/109  Pulse: 85  Resp: 18  Temp: 98.6 F (37 C)  SpO2: 100%     General: Awake, patient looks uncomfortable lying on her right side on the stretcher CV:  Good peripheral perfusion.  Resp:  Normal effort.  Abd:  No distention.  Neuro:             Awake, Alert, Oriented x 3  Other:  No midline lumbar tenderness, tenderness to palpation in the left  paraspinal region and left SI joint 5 out of 5 strength with plantarflexion dorsiflexion knee extension bilaterally, patient able to lift left leg off the bed but strength testing is limited secondary to pain Station gross intact in bilateral lower extremities   ED Results / Procedures / Treatments  Labs (all labs ordered are listed, but only abnormal results are displayed) Labs Reviewed - No data to display   EKG    RADIOLOGY    PROCEDURES:  Critical Care performed: No  Procedures   MEDICATIONS ORDERED IN ED: Medications  lidocaine (LIDODERM) 5 % 1 patch (1 patch Transdermal Patch Applied 12/12/22 1025)  ibuprofen (ADVIL) tablet 400 mg (has no administration in time range)  oxyCODONE-acetaminophen (PERCOCET/ROXICET) 5-325 MG per tablet 1 tablet (1 tablet Oral Given 12/12/22 1026)     IMPRESSION / MDM / ASSESSMENT AND PLAN / ED COURSE  I reviewed the triage vital signs and the nursing  notes.                              Patient's presentation is most consistent with acute, uncomplicated illness.  Differential diagnosis includes, but is not limited to, musculoskeletal low back pain, sciatica, lumbar radiculopathy, low suspicion for spinal epidural abscess or cauda equina syndrome  The patient is a 37 year old female who presents with low back pain rating down the left leg x 3 days.  Did not have any preceding injury she has no history of similar.  While pain does radiate down the leg she has no numbness or weakness.  No bowel bladder incontinence.  No red flags for cauda equina syndrome and strength is intact on exam today.  She has no fever or history of IV drug use low suspicion for spinal epidural abscess.  On exam she has no midline tenderness in the lumbar region at all paraspinal and over the SI joint.  She prefers to lay on her right side does have significant discomfort with laying flat on the bed but she is has good strength plantarflexion dorsiflexion knee extension.   Does have significant pain with extension of the left hip.  Suspect lumbar radiculopathy.  Will treat with Lidoderm patch Toradol and Percocet in the ED.  Explained to her the natural course of low back pain with expectation that we will not be able to relieve the pain completely today  Patient feeling improved after meds here.  Recommended Tylenol NSAIDs maintaining activity as tolerated.  Will prescribe short course of opiates.  Patient counseled about risks and benefits of      FINAL CLINICAL IMPRESSION(S) / ED DIAGNOSES   Final diagnoses:  Acute left-sided low back pain with left-sided sciatica     Rx / DC Orders   ED Discharge Orders          Ordered    oxyCODONE (ROXICODONE) 5 MG immediate release tablet  Every 8 hours PRN        12/12/22 1120             Note:  This document was prepared using Dragon voice recognition software and may include unintentional dictation errors.   Georga Hacking, MD 12/12/22 1120

## 2022-12-12 NOTE — Discharge Instructions (Addendum)
Try to stay active while avoiding activities that make your back feel worse.  Take Tylenol Motrin for pain.  You can take the oxycodone as needed for breakthrough pain.  Please do not drive after taking this medication.  If you are feeling constipated take MiraLAX which she can get over-the-counter.

## 2022-12-12 NOTE — ED Triage Notes (Signed)
Patient c/o left back/ buttocks pain that radiates down leg. Started monday

## 2023-01-09 DIAGNOSIS — Z8669 Personal history of other diseases of the nervous system and sense organs: Secondary | ICD-10-CM | POA: Diagnosis not present

## 2023-01-09 DIAGNOSIS — Z0000 Encounter for general adult medical examination without abnormal findings: Secondary | ICD-10-CM | POA: Diagnosis not present

## 2023-01-09 DIAGNOSIS — Z1331 Encounter for screening for depression: Secondary | ICD-10-CM | POA: Diagnosis not present

## 2023-01-09 DIAGNOSIS — Z130 Encounter for screening for diseases of the blood and blood-forming organs and certain disorders involving the immune mechanism: Secondary | ICD-10-CM | POA: Diagnosis not present

## 2023-01-15 DIAGNOSIS — F419 Anxiety disorder, unspecified: Secondary | ICD-10-CM | POA: Diagnosis not present

## 2023-01-18 DIAGNOSIS — Z13220 Encounter for screening for lipoid disorders: Secondary | ICD-10-CM | POA: Diagnosis not present

## 2023-01-18 DIAGNOSIS — Z131 Encounter for screening for diabetes mellitus: Secondary | ICD-10-CM | POA: Diagnosis not present

## 2023-01-29 DIAGNOSIS — F419 Anxiety disorder, unspecified: Secondary | ICD-10-CM | POA: Diagnosis not present

## 2023-02-13 DIAGNOSIS — F419 Anxiety disorder, unspecified: Secondary | ICD-10-CM | POA: Diagnosis not present

## 2023-02-25 DIAGNOSIS — F419 Anxiety disorder, unspecified: Secondary | ICD-10-CM | POA: Diagnosis not present

## 2023-03-19 DIAGNOSIS — F419 Anxiety disorder, unspecified: Secondary | ICD-10-CM | POA: Diagnosis not present

## 2023-04-02 DIAGNOSIS — F419 Anxiety disorder, unspecified: Secondary | ICD-10-CM | POA: Diagnosis not present

## 2023-04-22 DIAGNOSIS — S62616A Displaced fracture of proximal phalanx of right little finger, initial encounter for closed fracture: Secondary | ICD-10-CM | POA: Diagnosis not present

## 2023-04-22 DIAGNOSIS — M79644 Pain in right finger(s): Secondary | ICD-10-CM | POA: Diagnosis not present

## 2023-04-30 DIAGNOSIS — F419 Anxiety disorder, unspecified: Secondary | ICD-10-CM | POA: Diagnosis not present

## 2023-05-16 DIAGNOSIS — S62606A Fracture of unspecified phalanx of right little finger, initial encounter for closed fracture: Secondary | ICD-10-CM | POA: Diagnosis not present

## 2023-05-16 DIAGNOSIS — S62616P Displaced fracture of proximal phalanx of right little finger, subsequent encounter for fracture with malunion: Secondary | ICD-10-CM | POA: Diagnosis not present

## 2023-05-28 DIAGNOSIS — F419 Anxiety disorder, unspecified: Secondary | ICD-10-CM | POA: Diagnosis not present

## 2023-06-25 DIAGNOSIS — F419 Anxiety disorder, unspecified: Secondary | ICD-10-CM | POA: Diagnosis not present

## 2023-07-09 DIAGNOSIS — F419 Anxiety disorder, unspecified: Secondary | ICD-10-CM | POA: Diagnosis not present

## 2023-07-16 NOTE — Progress Notes (Unsigned)
PCP:  [facility]   No chief complaint on file.   HPI:      [name] is a 37 y.o. G3P0011 who LMP was No LMP recorded., presents today for her annual examination.  Her menses are monthly, lasting 3-4 days, light brown spotting only.  Dysmenorrhea none. She does not have intermenstrual bleeding.   Sex activity: sexually active--contraception-- lap BS 2/23 with Dr. Tiburcio Pea. Last Pap: 08/15/20  Results were: no abnormalities /neg HPV DNA  Hx of STDs: chlamydia, HPV, trich; neg STD testing 12/21   Hx of recurrent BV with atopobium, BVAB2, megasphaera on 8/17 One Swab. Treated with clindamycin crm in past but preferred oral flagyl. Using boric acid supp with sx control now. No recent sx.   There is no FH of breast cancer. There is no FH of ovarian cancer. The patient does self-breast exams.  Tobacco use: The patient denies current or previous tobacco use. Alcohol use: none No drug use.  Exercise: moderately active  She does get adequate calcium but not Vitamin D in her diet.   Past Medical History:  Diagnosis Date   Anemia    Migraines    otc med prn   SVD (spontaneous vaginal delivery)    x 1    Past Surgical History:  Procedure Laterality Date   COLPOSCOPY  2004   neg paps since   DILATION AND CURETTAGE OF UTERUS N/A 10/26/2014   Procedure: SUCTION DILATATION AND CURETTAGE  ;  Surgeon: Purcell Nails, MD;  Location: [facility];  Service: Gynecology;  Laterality: N/A;   IUD REMOVAL N/A 10/26/2021   Procedure: INTRAUTERINE DEVICE (IUD) REMOVAL;  Surgeon: Nadara Mustard, MD;  Location: [facility];  Service: Gynecology;  Laterality: N/A;   LAPAROSCOPIC BILATERAL SALPINGECTOMY Bilateral 10/26/2021   Procedure: LAPAROSCOPIC BILATERAL SALPINGECTOMY;  Surgeon: Nadara Mustard, MD;  Location: [facility] OR[facility];  Service: Gynecology;  Laterality: Bilateral;   WISDOM TOOTH EXTRACTION      Family History  Problem Relation Age of Onset   Cancer Maternal Grandmother         throat    Social History   Socioeconomic History   Marital status: Single    Spouse name: Not on file   Number of children: 1   Years of education: masters   Highest education level: Not on file  Occupational History   Occupation: [employer]  Tobacco Use   Smoking status: Never   Smokeless tobacco: Never  Vaping Use   Vaping status: Never Used  Substance and Sexual Activity   Alcohol use: Yes    Comment: socially   Drug use: No   Sexual activity: Yes    Comment: mirena  Other Topics Concern   Not on file  Social History Narrative   Patient lives at with her son and boyfriends, she drinks 4 soda's  A day.   Social Determinants of Health   Financial Resource Strain: Low Risk  (01/09/2023)   Received from [facility]   Overall Financial Resource Strain (CARDIA)    Difficulty of Paying Living Expenses: Not hard at all  Food Insecurity: No Food Insecurity (01/09/2023)   Received from [facility]   Hunger Vital Sign    Worried About Running Out of Food in the Last Year: Never true    Ran Out of Food in the Last Year: Never true  Transportation Needs: No Transportation Needs (01/09/2023)   Received from [facility]   PRAPARE - Transportation    In the past 12 months, has lack of transportation kept you from medical appointments or from getting medications?: No    Lack of Transportation (Non-Medical): No  Physical Activity: Not on file  Stress: Not on file  Social Connections: Not on file  Intimate Partner Violence: Not on file    Outpatient Medications Prior to Visit  Medication Sig Dispense Refill   acetaminophen (TYLENOL) 500 MG tablet Take 1,000 mg by mouth every 6 (six) hours as needed for mild pain. (Patient not taking: Reported on 10/31/2021)     ibuprofen (ADVIL) 200 MG tablet Take 400 mg by mouth every 6 (six) hours as needed for mild pain. (Patient not taking: Reported on 10/31/2021)      oxyCODONE-acetaminophen (PERCOCET/ROXICET) 5-325 MG tablet Take 1 tablet by mouth every 4 (four) hours as needed for moderate pain. (Patient not taking: Reported on 10/31/2021) 30 tablet 0   No facility-administered medications prior to visit.    ROS:  Review of Systems  Constitutional:  Negative for fatigue, fever and unexpected weight change.  Respiratory:  Negative for cough, shortness of breath and wheezing.   Cardiovascular:  Negative for chest pain, palpitations and leg swelling.  Gastrointestinal:  Negative for blood in stool, constipation, diarrhea, nausea and vomiting.  Endocrine: Negative for cold intolerance, heat intolerance and polyuria.  Genitourinary:  Negative for dyspareunia, dysuria, flank pain, frequency, genital sores, hematuria, menstrual problem, pelvic pain, urgency, vaginal bleeding, vaginal discharge and vaginal pain.  Musculoskeletal:  Negative for back pain, joint swelling and myalgias.  Skin:  Negative for rash.  Neurological:  Negative for dizziness, syncope, light-headedness, numbness and headaches.  Hematological:  Negative for adenopathy.  Psychiatric/Behavioral:  Negative for agitation, confusion, sleep disturbance and suicidal ideas. The patient is not nervous/anxious.   BREAST: No symptoms   Objective: There were no vitals taken for this visit.   Physical Exam Constitutional:      Appearance: She is well-developed.  Genitourinary:     Vulva normal.     Right Labia: No rash, tenderness or lesions.    Left Labia: No tenderness, lesions or rash.    No vaginal discharge, erythema or tenderness.      Right Adnexa: not tender and no mass present.    Left Adnexa: not tender and no mass present.    No cervical motion tenderness, friability or polyp.     IUD strings visualized.     Uterus is not enlarged or tender.  Breasts:    Right: No mass, nipple discharge, skin change or tenderness.     Left: No mass, nipple discharge, skin change or tenderness.   Neck:     Thyroid: No thyromegaly.  Cardiovascular:     Rate and Rhythm: Normal rate and regular rhythm.     Heart sounds: Normal heart sounds. No murmur heard. Pulmonary:     Effort: Pulmonary effort is normal.     Breath sounds: Normal breath sounds.  Abdominal:     Palpations: Abdomen is soft.     Tenderness: There is no abdominal tenderness. There is no guarding or rebound.  Musculoskeletal:        General: Normal range of motion.     Cervical back: Normal range of motion.  Lymphadenopathy:     Cervical: No cervical adenopathy.  Neurological:     General: No focal deficit present.     Mental Status: She is alert and oriented to person, place, and time.  Cranial Nerves: No cranial nerve deficit.  Skin:    General: Skin is warm and dry.  Psychiatric:        Mood and Affect: Mood normal.        Behavior: Behavior normal.        Thought Content: Thought content normal.        Judgment: Judgment normal.  Vitals reviewed.     Assessment/Plan: Encounter for annual routine gynecological examination  Screening for STD (sexually transmitted disease) - Plan: Cervicovaginal ancillary only  Encounter for routine checking of intrauterine contraceptive device (IUD); IUD has 8 yr indication. Pt would like TL. RTO with MD for consult, will remove IUD with procedure. F/u prn.     GYN counsel adequate intake of calcium and vitamin D, diet and exercise     F/U  No follow-ups on file.  Aeryn Medici B. Carel Schnee, PA-C 07/16/2023 1:45 PM

## 2023-07-18 ENCOUNTER — Other Ambulatory Visit (HOSPITAL_COMMUNITY)
Admission: RE | Admit: 2023-07-18 | Discharge: 2023-07-18 | Disposition: A | Discharge status: Home / Self Care | Payer: BC Managed Care – PPO | Source: Ambulatory Visit | Attending: Obstetrics and Gynecology | Admitting: Obstetrics and Gynecology

## 2023-07-18 ENCOUNTER — Ambulatory Visit (INDEPENDENT_AMBULATORY_CARE_PROVIDER_SITE_OTHER): Payer: BC Managed Care – PPO | Admitting: Obstetrics and Gynecology

## 2023-07-18 ENCOUNTER — Encounter: Payer: Self-pay | Admitting: Obstetrics and Gynecology

## 2023-07-18 VITALS — BP 116/79 | HR 80 | Ht 65.0 in | Wt 220.0 lb

## 2023-07-18 DIAGNOSIS — Z113 Encounter for screening for infections with a predominantly sexual mode of transmission: Secondary | ICD-10-CM | POA: Diagnosis not present

## 2023-07-18 DIAGNOSIS — Z01419 Encounter for gynecological examination (general) (routine) without abnormal findings: Secondary | ICD-10-CM | POA: Diagnosis not present

## 2023-07-18 DIAGNOSIS — Z713 Dietary counseling and surveillance: Secondary | ICD-10-CM

## 2023-07-18 NOTE — Patient Instructions (Signed)
I value your feedback and you entrusting us with your care. If you get a [facility] patient survey, I would appreciate you taking the time to let us know about your experience today. Thank you! ? ? ?

## 2023-07-22 LAB — CERVICOVAGINAL ANCILLARY ONLY
Chlamydia: NEGATIVE
Comment: NEGATIVE
Comment: NEGATIVE
Comment: NORMAL
Neisseria Gonorrhea: NEGATIVE
Trichomonas: NEGATIVE

## 2023-08-14 DIAGNOSIS — F419 Anxiety disorder, unspecified: Secondary | ICD-10-CM | POA: Diagnosis not present

## 2023-09-17 DIAGNOSIS — F419 Anxiety disorder, unspecified: Secondary | ICD-10-CM | POA: Diagnosis not present

## 2023-10-14 ENCOUNTER — Encounter: Payer: Self-pay | Admitting: *Deleted

## 2024-02-10 DIAGNOSIS — N760 Acute vaginitis: Secondary | ICD-10-CM | POA: Diagnosis not present

## 2024-02-10 DIAGNOSIS — Z113 Encounter for screening for infections with a predominantly sexual mode of transmission: Secondary | ICD-10-CM | POA: Diagnosis not present

## 2024-03-04 DIAGNOSIS — J40 Bronchitis, not specified as acute or chronic: Secondary | ICD-10-CM | POA: Diagnosis not present

## 2024-06-23 DIAGNOSIS — Z113 Encounter for screening for infections with a predominantly sexual mode of transmission: Secondary | ICD-10-CM | POA: Diagnosis not present

## 2024-06-23 DIAGNOSIS — B3731 Acute candidiasis of vulva and vagina: Secondary | ICD-10-CM | POA: Diagnosis not present

## 2024-06-23 DIAGNOSIS — N898 Other specified noninflammatory disorders of vagina: Secondary | ICD-10-CM | POA: Diagnosis not present

## 2024-06-24 DIAGNOSIS — J208 Acute bronchitis due to other specified organisms: Secondary | ICD-10-CM | POA: Diagnosis not present

## 2024-08-06 ENCOUNTER — Encounter: Payer: Self-pay | Admitting: Obstetrics and Gynecology

## 2024-11-10 ENCOUNTER — Ambulatory Visit: Admitting: Obstetrics & Gynecology

## 2024-11-19 ENCOUNTER — Ambulatory Visit: Admitting: Family Medicine

## 2025-02-10 ENCOUNTER — Ambulatory Visit: Admitting: Family Medicine
